# Patient Record
Sex: Female | Born: 2000 | Race: White | Hispanic: No | Marital: Single | Smoking: Never smoker
Health system: Southern US, Community
[De-identification: ages and names within clinical notes are randomized; demographics above are authoritative.]

## PROBLEM LIST (undated history)

## (undated) ENCOUNTER — Inpatient Hospital Stay (HOSPITAL_COMMUNITY): Payer: Self-pay

## (undated) DIAGNOSIS — N159 Renal tubulo-interstitial disease, unspecified: Secondary | ICD-10-CM

## (undated) DIAGNOSIS — K5900 Constipation, unspecified: Secondary | ICD-10-CM

## (undated) HISTORY — PX: NO PAST SURGERIES: SHX2092

---
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
## 2019-01-19 ENCOUNTER — Ambulatory Visit: Payer: Medicaid Other | Admitting: Obstetrics and Gynecology

## 2019-01-21 DIAGNOSIS — Z202 Contact with and (suspected) exposure to infections with a predominantly sexual mode of transmission: Secondary | ICD-10-CM | POA: Diagnosis not present

## 2019-01-26 DIAGNOSIS — Z202 Contact with and (suspected) exposure to infections with a predominantly sexual mode of transmission: Secondary | ICD-10-CM | POA: Diagnosis not present

## 2019-01-28 DIAGNOSIS — A562 Chlamydial infection of genitourinary tract, unspecified: Secondary | ICD-10-CM | POA: Diagnosis not present

## 2019-01-28 DIAGNOSIS — N342 Other urethritis: Secondary | ICD-10-CM | POA: Diagnosis not present

## 2019-01-31 ENCOUNTER — Encounter: Payer: Self-pay | Admitting: Women's Health

## 2019-01-31 ENCOUNTER — Ambulatory Visit (INDEPENDENT_AMBULATORY_CARE_PROVIDER_SITE_OTHER): Payer: Medicaid Other | Admitting: Women's Health

## 2019-01-31 DIAGNOSIS — A749 Chlamydial infection, unspecified: Secondary | ICD-10-CM | POA: Diagnosis not present

## 2019-01-31 MED ORDER — AZITHROMYCIN 500 MG PO TABS: 1000.0000 mg | ORAL_TABLET | Freq: Once | ORAL | 0 refills | Status: AC

## 2019-01-31 NOTE — Progress Notes (Addendum)
   GYN VISIT Patient name: Gina Roman MRN [id number]  Date of birth: February 15, 2001 Chief Complaint:   urgent care friday (told +chlamydia/ taking doxycycline)  History of Present Illness:   Gina Roman is a 18 y.o. G1P0 Caucasian female being seen today for f/u after urgent care visit. Slept w/ previous partner, he told her she needed to get checked for STDs, gave urine specimen at urgent care Wed, rx'd doxycycline Friday for +CT. Had sex w/ partner again last Wed. Has thrown up 3 of the 6 pills so far. Is breastfeeding.      No LMP recorded. (Menstrual status: IUD).  Contraception: IUD Last pap <21yo. Results were:  n/a Review of Systems:   Pertinent items are noted in HPI Denies fever/chills, dizziness, headaches, visual disturbances, fatigue, shortness of breath, chest pain, abdominal pain, vomiting, abnormal vaginal discharge/itching/odor/irritation, problems with periods, bowel movements, urination, or intercourse unless otherwise stated above.  Pertinent History Reviewed:  Reviewed past medical,surgical, social, obstetrical and family history.  Reviewed problem list, medications and allergies. Physical Assessment:   Vitals:   01/31/19 1551  BP: 100/68  Pulse: 83  Weight: 138 lb (62.6 kg)  Height: 5\' 3"  (1.6 m)  Body mass index is 24.45 kg/m.       Physical Examination:   General appearance: alert, well appearing, and in no distress  Mental status: alert, oriented to person, place, and time  Skin: warm & dry   Cardiovascular: normal heart rate noted  Respiratory: normal respiratory effort, no distress  Abdomen: soft, non-tender   Pelvic: examination not indicated  Extremities: no edema   No results found for this or any previous visit (from the past 24 hour(s)).  Assessment & Plan:  1) Recent +CT> vomited 1/2 of doxycycline so far. Stop doxycycline. Rx azithromycin 1gm po x 1. No sex x at least 1wk from time finished taking meds. Partner needs tx, if wants me to tx  call back w/ info, otherwise go to HD or PCP. Return in 3-4wks for POC  Meds:  Meds ordered this encounter  Medications  . azithromycin (ZITHROMAX) 500 MG tablet    Sig: Take 2 tablets (1,000 mg total) by mouth once for 1 dose.    Dispense:  2 tablet    Refill:  0    Order Specific Question:   Supervising Provider    Answer:   Despina Hidden, LUTHER H [[id number]]    No orders of the defined types were placed in this encounter.   Return in about 3 weeks (around 02/21/2019) for urine specimen for lab.  Cheral Marker CNM, [facility] 01/31/2019 4:18 PM

## 2019-02-03 ENCOUNTER — Ambulatory Visit: Payer: Medicaid Other | Admitting: Advanced Practice Midwife

## 2019-02-11 DIAGNOSIS — Z113 Encounter for screening for infections with a predominantly sexual mode of transmission: Secondary | ICD-10-CM | POA: Diagnosis not present

## 2019-02-21 ENCOUNTER — Other Ambulatory Visit: Payer: Medicaid Other

## 2019-05-13 DIAGNOSIS — Z5181 Encounter for therapeutic drug level monitoring: Secondary | ICD-10-CM | POA: Diagnosis not present

## 2019-05-16 DIAGNOSIS — Z5181 Encounter for therapeutic drug level monitoring: Secondary | ICD-10-CM | POA: Diagnosis not present

## 2019-05-17 ENCOUNTER — Telehealth: Payer: Self-pay | Admitting: Adult Health

## 2019-05-17 NOTE — Telephone Encounter (Signed)
Patient called, would like to be checked for STD.  Please advise.  [place]  [phone]

## 2019-05-17 NOTE — Telephone Encounter (Signed)
Spoke with pt. Pt wants STD screen. Pt is not having any symptoms. Advised pt can come in for self swab. Pt voiced understanding. Attempted to transfer call to front desk but was advised if call got disconnected, call office back for appt. Gina Roman

## 2019-05-19 ENCOUNTER — Other Ambulatory Visit: Payer: Medicaid Other

## 2019-05-20 ENCOUNTER — Other Ambulatory Visit: Payer: Medicaid Other

## 2019-05-20 DIAGNOSIS — Z5181 Encounter for therapeutic drug level monitoring: Secondary | ICD-10-CM | POA: Diagnosis not present

## 2019-05-23 ENCOUNTER — Other Ambulatory Visit (INDEPENDENT_AMBULATORY_CARE_PROVIDER_SITE_OTHER): Payer: Medicaid Other

## 2019-05-23 ENCOUNTER — Other Ambulatory Visit: Payer: Self-pay

## 2019-05-23 DIAGNOSIS — R309 Painful micturition, unspecified: Secondary | ICD-10-CM

## 2019-05-23 DIAGNOSIS — R319 Hematuria, unspecified: Secondary | ICD-10-CM | POA: Diagnosis not present

## 2019-05-23 DIAGNOSIS — Z5181 Encounter for therapeutic drug level monitoring: Secondary | ICD-10-CM | POA: Diagnosis not present

## 2019-05-23 DIAGNOSIS — Z113 Encounter for screening for infections with a predominantly sexual mode of transmission: Secondary | ICD-10-CM

## 2019-05-23 LAB — POCT URINALYSIS DIPSTICK
Glucose, UA: NEGATIVE
Ketones, UA: NEGATIVE
Nitrite, UA: NEGATIVE
Protein, UA: POSITIVE — AB

## 2019-05-23 NOTE — Progress Notes (Signed)
Pt here for NuSwab for STD screening. Pt also has pain with urination, + pressure. I dipped urine and it was + for blood, protein and leuks. I sent urine to lab for culture and urinalysis. Pt voiced understanding. [place]

## 2019-05-24 LAB — MICROSCOPIC EXAMINATION
Casts: NONE SEEN /lpf
Epithelial Cells (non renal): >10 /hpf — AB (ref 0–10)

## 2019-05-24 LAB — URINALYSIS, ROUTINE W REFLEX MICROSCOPIC
Bilirubin, UA: NEGATIVE
Glucose, UA: NEGATIVE
Ketones, UA: NEGATIVE
Nitrite, UA: NEGATIVE
Specific Gravity, UA: 1.016 (ref 1.005–1.030)
Urobilinogen, Ur: 1 mg/dL (ref 0.2–1.0)
pH, UA: 8 — ABNORMAL HIGH (ref 5.0–7.5)

## 2019-05-25 ENCOUNTER — Other Ambulatory Visit: Payer: Self-pay

## 2019-05-25 ENCOUNTER — Emergency Department (HOSPITAL_COMMUNITY)
Admission: EM | Admit: 2019-05-25 | Discharge: 2019-05-25 | Disposition: A | Discharge status: Home / Self Care | Payer: Medicaid Other | Attending: Emergency Medicine | Admitting: Emergency Medicine

## 2019-05-25 ENCOUNTER — Encounter (HOSPITAL_COMMUNITY): Payer: Self-pay | Admitting: Emergency Medicine

## 2019-05-25 DIAGNOSIS — R1030 Lower abdominal pain, unspecified: Secondary | ICD-10-CM | POA: Diagnosis present

## 2019-05-25 DIAGNOSIS — N3090 Cystitis, unspecified without hematuria: Secondary | ICD-10-CM | POA: Insufficient documentation

## 2019-05-25 DIAGNOSIS — Z79899 Other long term (current) drug therapy: Secondary | ICD-10-CM | POA: Diagnosis not present

## 2019-05-25 DIAGNOSIS — Z5181 Encounter for therapeutic drug level monitoring: Secondary | ICD-10-CM | POA: Diagnosis not present

## 2019-05-25 DIAGNOSIS — Z7722 Contact with and (suspected) exposure to environmental tobacco smoke (acute) (chronic): Secondary | ICD-10-CM | POA: Insufficient documentation

## 2019-05-25 LAB — URINALYSIS, ROUTINE W REFLEX MICROSCOPIC
Bilirubin Urine: NEGATIVE
Glucose, UA: NEGATIVE mg/dL
Ketones, ur: NEGATIVE mg/dL
Nitrite: NEGATIVE
Protein, ur: NEGATIVE mg/dL
Specific Gravity, Urine: 1.006 (ref 1.005–1.030)
WBC, UA: >50 WBC/hpf — ABNORMAL HIGH (ref 0–5)
pH: 6 (ref 5.0–8.0)

## 2019-05-25 LAB — URINE CULTURE

## 2019-05-25 LAB — PREGNANCY, URINE: Preg Test, Ur: NEGATIVE

## 2019-05-25 MED ORDER — PHENAZOPYRIDINE HCL 200 MG PO TABS: 200.0000 mg | ORAL_TABLET | Freq: Three times a day (TID) | ORAL | 0 refills | Status: DC

## 2019-05-25 MED ORDER — CEPHALEXIN 500 MG PO CAPS: 500.0000 mg | ORAL_CAPSULE | Freq: Two times a day (BID) | ORAL | 0 refills | Status: AC

## 2019-05-25 NOTE — ED Triage Notes (Signed)
Pt c/o of dysuria, hematuria, foul odor with urination x 1 week

## 2019-05-25 NOTE — ED Notes (Signed)
Pt verbalized understanding. Signature pad not working

## 2019-05-25 NOTE — Discharge Instructions (Signed)
Please take all of your antibiotics until finished!   You may develop abdominal discomfort or diarrhea from the antibiotic.  You may help offset this with probiotics which you can buy or get in yogurt. Do not eat or take the probiotics until 2 hours after your antibiotic.   Continue to stay well-hydrated by drinking plenty water. Follow-up with a primary care provider should symptoms fail to resolve.  Return to the ED for development of fever, increased pain, pain in the sides or back, or any other major concerns.

## 2019-05-25 NOTE — ED Provider Notes (Signed)
[facility] EMERGENCY DEPARTMENT Provider Note   CSN: [id number] Arrival date & time: 05/25/19  1339    History   Chief Complaint Chief Complaint  Patient presents with  . Dysuria    HPI Gina Roman is a 18 y.o. female.     HPI   Gina Roman is a 18 y.o. female, patient with no pertinent past medical history, presenting to the ED with suprapubic discomfort for last week.  Sensation was intermittent surrounding times of urination, but has started to be more constant.  Sensation is mild to moderate and radiates into the genital region.  She has noted a foul odor and cloudiness to the urine as well as occasional hematuria. She does not have menstrual cycles due to an IUD placed about a year ago.  Denies fever/chills, N/V/C/D, vaginal discharge, vaginal bleeding, flank/back pain, other abdominal pain, or any other complaints.    Past Medical History:  Diagnosis Date  . Constipation   . Kidney infection     Patient Active Problem List   Diagnosis Date Noted  . Chlamydia 01/31/2019    Past Surgical History:  Procedure Laterality Date  . NO PAST SURGERIES       OB History    Gravida  1   Para      Term      Preterm      AB      Living        SAB      TAB      Ectopic      Multiple  1   Live Births               Home Medications    Prior to Admission medications   Medication Sig Start Date End Date Taking? Authorizing Provider  cephALEXin (KEFLEX) 500 MG capsule Take 1 capsule (500 mg total) by mouth 2 (two) times daily for 3 days. 05/25/19 05/28/19  Paelyn Smick C, PA-C  diphenhydrAMINE (BENADRYL) 25 MG tablet Take 25 mg by mouth every 6 (six) hours as needed for itching or allergies.    [provider]  doxycycline (VIBRAMYCIN) 100 MG capsule Take 100 mg by mouth 2 (two) times daily.    [provider]  phenazopyridine (PYRIDIUM) 200 MG tablet Take 1 tablet (200 mg total) by mouth 3 (three) times daily. 05/25/19   Oberon Hehir,  Onesti Bonfiglio C, PA-C  prenatal vitamin w/FE, FA (PRENATAL 1 + 1) 27-1 MG TABS tablet Take 1 tablet by mouth daily at 12 noon.    [provider]  sertraline (ZOLOFT) 25 MG tablet Take 1 tablet (25 mg total) by mouth daily. Patient not taking: Reported on 01/31/2019 09/01/18   Jacklyn Shellresenzo-Dishmon, Frances, CNM    Family History No family history on file.  Social History Social History   Tobacco Use  . Smoking status: Passive Smoke Exposure - Never Smoker  . Smokeless tobacco: Never Used  Substance Use Topics  . Alcohol use: No  . Drug use: No     Allergies   Patient has no known allergies.   Review of Systems Review of Systems  Constitutional: Negative for fever.  Gastrointestinal: Positive for abdominal pain. Negative for diarrhea, nausea and vomiting.  Genitourinary: Positive for dysuria and hematuria. Negative for difficulty urinating, flank pain, vaginal bleeding and vaginal discharge.  Musculoskeletal: Negative for back pain.  All other systems reviewed and are negative.    Physical Exam Updated Vital Signs BP 106/73 (BP Location: Right Arm)   Pulse Marland Kitchen(!)  106   Temp 98.4 F (36.9 C) (Oral)   Resp 12   Ht 5\' 4"  (1.626 m)   Wt 59 kg   SpO2 97%   BMI 22.31 kg/m   Physical Exam Vitals signs and nursing note reviewed.  Constitutional:      General: She is not in acute distress.    Appearance: She is well-developed. She is not diaphoretic.  HENT:     Head: Normocephalic and atraumatic.     Mouth/Throat:     Mouth: Mucous membranes are moist.     Pharynx: Oropharynx is clear.  Eyes:     Conjunctiva/sclera: Conjunctivae normal.  Neck:     Musculoskeletal: Neck supple.  Cardiovascular:     Rate and Rhythm: Normal rate and regular rhythm.     Pulses: Normal pulses.          Radial pulses are 2+ on the right side and 2+ on the left side.       Posterior tibial pulses are 2+ on the right side and 2+ on the left side.     Heart sounds: Normal heart sounds.   Pulmonary:     Effort: Pulmonary effort is normal. No respiratory distress.     Breath sounds: Normal breath sounds.  Abdominal:     Palpations: Abdomen is soft.     Tenderness: There is abdominal tenderness (mild). There is no guarding.    Musculoskeletal:     Right lower leg: No edema.     Left lower leg: No edema.  Lymphadenopathy:     Cervical: No cervical adenopathy.  Skin:    General: Skin is warm and dry.  Neurological:     Mental Status: She is alert.  Psychiatric:        Mood and Affect: Mood and affect normal.        Speech: Speech normal.        Behavior: Behavior normal.      ED Treatments / Results  Labs (all labs ordered are listed, but only abnormal results are displayed) Labs Reviewed  URINALYSIS, ROUTINE W REFLEX MICROSCOPIC - Abnormal; Notable for the following components:      Result Value   Hgb urine dipstick LARGE (*)    Leukocytes,Ua LARGE (*)    WBC, UA >50 (*)    Bacteria, UA MANY (*)    All other components within normal limits  URINE CULTURE  PREGNANCY, URINE  GC/CHLAMYDIA PROBE AMP () NOT AT [facility]    EKG None  Radiology No results found.  Procedures Procedures (including critical care time)  Medications Ordered in ED Medications - No data to display   Initial Impression / Assessment and Plan / ED Course  I have reviewed the triage vital signs and the nursing notes.  Pertinent labs & imaging results that were available during my care of the patient were reviewed by me and considered in my medical decision making (see chart for details).  Clinical Course as of May 24 1524  Wed May 25, 2019  1510 Patient had a pulse of 92 during my exam.  Pulse Rate(!): 106 [SJ]    Clinical Course User Index [SJ] Manya Balash C, PA-C       Patient presents with suprapubic pressure and pain with urination.  She does not have signs of systemic illness.  When I asked if patient had concern for STDs and if she wanted STD testing, she  stated she might be interested in this.  Pelvic exam  with swabs was discussed and encouraged with explanation.  Patient declined this exam.  She also declined HIV and RPR testing.  Alternatives were discussed including follow-up with PCP, OB/GYN, or health department. She asked if we could run the GC/chlamydia from the urine.  We discussed the decreased accuracy in female patients using urine for this test.  Patient voiced understanding.  There is some evidence of UTI on patient's UA.  We will send urine culture and treat with antibiotics. Return precautions discussed. Patient voices understanding of these instructions, accepts the plan, and is comfortable with discharge.  Final Clinical Impressions(s) / ED Diagnoses   Final diagnoses:  Cystitis    ED Discharge Orders         Ordered    cephALEXin (KEFLEX) 500 MG capsule  2 times daily     05/25/19 1516    phenazopyridine (PYRIDIUM) 200 MG tablet  3 times daily     05/25/19 1518           Anselm PancoastJoy, Romario Tith C, PA-C 05/25/19 1529    Blane OharaZavitz, Joshua, MD 05/25/19 1537

## 2019-05-26 ENCOUNTER — Telehealth: Payer: Self-pay | Admitting: Adult Health

## 2019-05-26 LAB — NUSWAB VAGINITIS PLUS (VG+)
Candida albicans, NAA: NEGATIVE
Candida glabrata, NAA: NEGATIVE
Chlamydia trachomatis, NAA: NEGATIVE
Neisseria gonorrhoeae, NAA: NEGATIVE
Trich vag by NAA: NEGATIVE

## 2019-05-26 LAB — GC/CHLAMYDIA PROBE AMP (~~LOC~~) NOT AT ARMC
Chlamydia: NEGATIVE
Neisseria Gonorrhea: NEGATIVE

## 2019-05-26 LAB — URINE CULTURE

## 2019-05-26 NOTE — Telephone Encounter (Signed)
Pt aware that nuswab is negative

## 2019-05-30 DIAGNOSIS — Z5181 Encounter for therapeutic drug level monitoring: Secondary | ICD-10-CM | POA: Diagnosis not present

## 2019-06-03 DIAGNOSIS — Z5181 Encounter for therapeutic drug level monitoring: Secondary | ICD-10-CM | POA: Diagnosis not present

## 2019-06-28 ENCOUNTER — Telehealth: Payer: Self-pay | Admitting: Advanced Practice Midwife

## 2019-06-28 DIAGNOSIS — N760 Acute vaginitis: Secondary | ICD-10-CM | POA: Diagnosis not present

## 2019-06-28 NOTE — Telephone Encounter (Signed)
Pt is calling made appt next week to have her Iud checked but wanted to know if she could come in and give urine sample for possible uti

## 2019-06-28 NOTE — Telephone Encounter (Signed)
Left message for pt to call back to schedule.

## 2019-07-05 ENCOUNTER — Telehealth: Payer: Self-pay | Admitting: Obstetrics and Gynecology

## 2019-07-05 NOTE — Telephone Encounter (Signed)
error 

## 2019-07-06 ENCOUNTER — Ambulatory Visit: Payer: Medicaid Other | Admitting: Obstetrics and Gynecology

## 2019-08-24 ENCOUNTER — Telehealth: Payer: Self-pay | Admitting: *Deleted

## 2019-08-24 NOTE — Telephone Encounter (Signed)
Pt left message requesting std screening appt asap. Attempted to call patient back but phone went straight to vm that was not set up.

## 2019-09-08 DIAGNOSIS — N760 Acute vaginitis: Secondary | ICD-10-CM | POA: Diagnosis not present

## 2019-09-08 DIAGNOSIS — B9689 Other specified bacterial agents as the cause of diseases classified elsewhere: Secondary | ICD-10-CM | POA: Diagnosis not present

## 2019-10-04 ENCOUNTER — Telehealth: Payer: Self-pay | Admitting: Obstetrics & Gynecology

## 2019-10-04 NOTE — Telephone Encounter (Signed)

## 2019-10-05 ENCOUNTER — Other Ambulatory Visit: Payer: Self-pay

## 2019-10-05 ENCOUNTER — Other Ambulatory Visit: Payer: Medicaid Other

## 2019-10-05 ENCOUNTER — Other Ambulatory Visit (HOSPITAL_COMMUNITY)
Admission: RE | Admit: 2019-10-05 | Discharge: 2019-10-05 | Disposition: A | Discharge status: Home / Self Care | Payer: Medicaid Other | Source: Ambulatory Visit | Attending: Obstetrics & Gynecology | Admitting: Obstetrics & Gynecology

## 2019-10-05 DIAGNOSIS — N898 Other specified noninflammatory disorders of vagina: Secondary | ICD-10-CM | POA: Diagnosis not present

## 2019-10-05 NOTE — Progress Notes (Signed)
Chart reviewed for nurse visit. Agree with plan of care.  Estill Dooms, NP 10/05/2019 12:20 PM

## 2019-10-05 NOTE — Progress Notes (Signed)
   NURSE VISIT- VAGINITIS/STD  SUBJECTIVE:  Gina Roman is a 18 y.o. G1P0 GYN patientfemale here for a vaginal swab for vaginitis screening, STD screen.  She reports the following symptoms: discharge described as curd-like and itching for 1 week. Denies abnormal vaginal bleeding, significant pelvic pain, fever, or UTI symptoms.  OBJECTIVE:  There were no vitals taken for this visit.  Appears well, in no apparent distress  ASSESSMENT: Vaginal swab for vaginitis screening/STD screen  PLAN: Self-collected vaginal probe for Gonorrhea, Chlamydia, Trichomonas, Bacterial Vaginosis, Yeast sent to lab Treatment: to be determined once results are received Follow-up as needed if symptoms persist/worsen, or new symptoms develop  Alice Rieger  10/05/2019 9:16 AM

## 2019-10-06 LAB — CERVICOVAGINAL ANCILLARY ONLY
Bacterial Vaginitis (gardnerella): POSITIVE — AB
Candida Glabrata: NEGATIVE
Candida Vaginitis: POSITIVE — AB
Chlamydia: POSITIVE — AB
Comment: NEGATIVE
Comment: NEGATIVE
Comment: NEGATIVE
Comment: NEGATIVE
Comment: NEGATIVE
Comment: NORMAL
Neisseria Gonorrhea: NEGATIVE
Trichomonas: NEGATIVE

## 2019-10-10 ENCOUNTER — Telehealth: Payer: Self-pay | Admitting: Adult Health

## 2019-10-10 ENCOUNTER — Encounter: Payer: Self-pay | Admitting: Adult Health

## 2019-10-10 DIAGNOSIS — N760 Acute vaginitis: Secondary | ICD-10-CM | POA: Insufficient documentation

## 2019-10-10 DIAGNOSIS — B9689 Other specified bacterial agents as the cause of diseases classified elsewhere: Secondary | ICD-10-CM | POA: Insufficient documentation

## 2019-10-10 DIAGNOSIS — A749 Chlamydial infection, unspecified: Secondary | ICD-10-CM

## 2019-10-10 HISTORY — DX: Other specified bacterial agents as the cause of diseases classified elsewhere: B96.89

## 2019-10-10 MED ORDER — METRONIDAZOLE 500 MG PO TABS: 500.0000 mg | ORAL_TABLET | Freq: Two times a day (BID) | ORAL | 0 refills | Status: DC

## 2019-10-10 MED ORDER — FLUCONAZOLE 150 MG PO TABS: ORAL_TABLET | ORAL | 1 refills | Status: DC

## 2019-10-10 MED ORDER — AZITHROMYCIN 500 MG PO TABS: ORAL_TABLET | ORAL | 0 refills | Status: DC

## 2019-10-10 NOTE — Telephone Encounter (Signed)
Pt aware that CV swab, +BV,YEast and chlamydia, will rx flagyl 500 mg 1 bid x 7 days and diflucan and azithromycin 500 mg #2 2 po now , no sex, POC 12/7 at 9 am for self swab, and partner to clinic, [facility] sent.

## 2019-10-10 NOTE — Telephone Encounter (Signed)
Kyia called back and wants me to treat partner, Carollee Herter, DOB 11/21/1999, allergic to PCN, will Rx azithromycin 500 mg #2 2 po now, at [place] on [address] at [phone].

## 2019-10-11 ENCOUNTER — Other Ambulatory Visit: Payer: Medicaid Other

## 2019-11-11 ENCOUNTER — Other Ambulatory Visit: Payer: Medicaid Other

## 2020-01-04 ENCOUNTER — Other Ambulatory Visit (HOSPITAL_COMMUNITY)
Admission: RE | Admit: 2020-01-04 | Discharge: 2020-01-04 | Disposition: A | Discharge status: Home / Self Care | Payer: Medicaid Other | Source: Ambulatory Visit | Attending: Advanced Practice Midwife | Admitting: Advanced Practice Midwife

## 2020-01-04 ENCOUNTER — Ambulatory Visit (INDEPENDENT_AMBULATORY_CARE_PROVIDER_SITE_OTHER): Payer: Medicaid Other | Admitting: Advanced Practice Midwife

## 2020-01-04 ENCOUNTER — Encounter: Payer: Self-pay | Admitting: Advanced Practice Midwife

## 2020-01-04 ENCOUNTER — Other Ambulatory Visit: Payer: Self-pay

## 2020-01-04 VITALS — BP 105/68 | HR 87 | Ht 64.0 in | Wt 145.2 lb

## 2020-01-04 DIAGNOSIS — A749 Chlamydial infection, unspecified: Secondary | ICD-10-CM

## 2020-01-04 DIAGNOSIS — R5383 Other fatigue: Secondary | ICD-10-CM | POA: Diagnosis not present

## 2020-01-04 NOTE — Progress Notes (Signed)
   GYN VISIT Patient name: Gina Roman MRN [id number]  Date of birth: 08/24/2001 Chief Complaint:   Feels tired; didn't get TOC for chlamydia  History of Present Illness:   Gina Roman is a 19 y.o. G1P0 Caucasian female being seen today for eval of feeling tired. Also was tx for chlamydia in November 2020 and didn't have POC. She reports being incontinent of diarrhea x 2 as well and is concerned if that is related to her fatigue.   No LMP recorded. (Menstrual status: IUD). The current method of family planning is IUD.  Last pap <20yo.  Review of Systems:   Pertinent items are noted in HPI Denies fever/chills, dizziness, headaches, visual disturbances, fatigue, shortness of breath, chest pain, abdominal pain, vomiting, abnormal vaginal discharge/itching/odor/irritation, problems with periods, bowel movements, urination, or intercourse unless otherwise stated above.  Pertinent History Reviewed:  Reviewed past medical,surgical, social, obstetrical and family history.  Reviewed problem list, medications and allergies. Physical Assessment:   Vitals:   01/04/20 1013  BP: 105/68  Pulse: 87  Weight: 145 lb 3.2 oz (65.9 kg)  Height: 5\' 4"  (1.626 m)  Body mass index is 24.92 kg/m.       Physical Examination:   General appearance: alert, well appearing, and in no distress  Mental status: alert, oriented to person, place, and time  Skin: warm & dry   Cardiovascular: normal heart rate noted  Respiratory: normal respiratory effort, no distress  Abdomen: soft, non-tender   Pelvic: normal external genitalia, vulva, vagina, cervix, uterus and adnexa, examination not indicated  Extremities: no edema   Chaperone: n/a    No results found for this or any previous visit (from the past 24 hour(s)).  Assessment & Plan:  1) Fatigue> requesting check of B12, will also get Vit D and CBC  2) Needs chlamydia POC> collected today  3) Incontinent of diarrhea> given list of fiber-rich foods;  if diarrhea resolves and incontinence continues, rec see PCP  Meds: No orders of the defined types were placed in this encounter.   Orders Placed This Encounter  Procedures  . B12  . CBC  . Vitamin D (25 hydroxy)    Return for prn.  CNM 01/04/2020 11:46 AM

## 2020-01-04 NOTE — Patient Instructions (Signed)
Fiber Content in Foods  See the following list for the dietary fiber content of some common foods. High-fiber foods High-fiber foods contain 4 grams or more (4g or more) of fiber per serving. They include:  Artichoke (fresh) -- 1 medium has 10.3g of fiber.  Baked beans, plain or vegetarian (canned) --  cup has 5.2g of fiber.  Blackberries or raspberries (fresh) --  cup has 4g of fiber.  Bran cereal --  cup has 8.6g of fiber.  Bulgur (cooked) --  cup has 4g of fiber.  Kidney beans (canned) --  cup has 6.8g of fiber.  Lentils (cooked) --  cup has 7.8g of fiber.  Pear (fresh) -- 1 medium has 5.1g of fiber.  Peas (frozen) --  cup has 4.4g of fiber.  Pinto beans (canned) --  cup has 5.5g of fiber.  Pinto beans (dried and cooked) --  cup has 7.7g of fiber.  Potato with skin (baked) -- 1 medium has 4.4g of fiber.  Quinoa (cooked) --  cup has 5g of fiber.  Soybeans (canned, frozen, or fresh) --  cup has 5.1g of fiber. Moderate-fiber foods Moderate-fiber foods contain 1-4 grams (1-4g) of fiber per serving. They include:  Almonds -- 1 oz. has 3.5g of fiber.  Apple with skin -- 1 medium has 3.3g of fiber.  Applesauce, sweetened --  cup has 1.5g of fiber.  Bagel, plain -- one 4-inch (10-cm) bagel has 2g of fiber.  Banana -- 1 medium has 3.1g of fiber.  Broccoli (cooked) --  cup has 2.5g of fiber.  Carrots (cooked) --  cup has 2.3g of fiber.  Corn (canned or frozen) --  cup has 2.1g of fiber.  Corn tortilla -- one 6-inch (15-cm) tortilla has 1.5g of fiber.  Green beans (canned) --  cup has 2g of fiber.  Instant oatmeal --  cup has about 2g of fiber.  Long-grain brown rice (cooked) -- 1 cup has 3.5g of fiber.  Macaroni, enriched (cooked) -- 1 cup has 2.5g of fiber.  Melon -- 1 cup has 1.4g of fiber.  Multigrain cereal --  cup has about 2-4g of fiber.  Orange -- 1 small has 3.1g of fiber.  Potatoes, mashed --  cup has 1.6g of fiber.  Raisins  -- 1/4 cup has 1.6g of fiber.  Squash --  cup has 2.9g of fiber.  Sunflower seeds --  cup has 1.1g of fiber.  Tomato -- 1 medium has 1.5g of fiber.  Vegetable or soy patty -- 1 has 3.4g of fiber.  Whole-wheat bread -- 1 slice has 2g of fiber.  Whole-wheat spaghetti --  cup has 3.2g of fiber. Low-fiber foods Low-fiber foods contain less than 1 gram (less than 1g) of fiber per serving. They include:  Egg -- 1 large.  Flour tortilla -- one 6-inch (15-cm) tortilla.  Fruit juice --  cup.  Lettuce -- 1 cup.  Meat, poultry, or fish -- 1 oz.  Milk -- 1 cup.  Spinach (raw) -- 1 cup.  White bread -- 1 slice.  White rice --  cup.  Yogurt --  cup. Actual amounts of fiber in foods may be different depending on processing. Talk with your dietitian about how much fiber you need in your diet. This information is not intended to replace advice given to you by your health care provider. Make sure you discuss any questions you have with your health care provider. Document Revised: 07/10/2016 Document Reviewed: 01/10/2016 Elsevier Patient Education  2020 Elsevier Inc.  

## 2020-01-05 LAB — CERVICOVAGINAL ANCILLARY ONLY
Chlamydia: NEGATIVE
Comment: NEGATIVE
Comment: NEGATIVE
Comment: NORMAL
Neisseria Gonorrhea: NEGATIVE
Trichomonas: NEGATIVE

## 2020-03-02 DIAGNOSIS — N760 Acute vaginitis: Secondary | ICD-10-CM | POA: Diagnosis not present

## 2020-03-02 DIAGNOSIS — B9689 Other specified bacterial agents as the cause of diseases classified elsewhere: Secondary | ICD-10-CM | POA: Diagnosis not present

## 2020-03-29 DIAGNOSIS — B9689 Other specified bacterial agents as the cause of diseases classified elsewhere: Secondary | ICD-10-CM | POA: Diagnosis not present

## 2020-03-29 DIAGNOSIS — N760 Acute vaginitis: Secondary | ICD-10-CM | POA: Diagnosis not present

## 2020-04-20 ENCOUNTER — Ambulatory Visit
Admission: EM | Admit: 2020-04-20 | Discharge: 2020-04-20 | Disposition: A | Discharge status: Home / Self Care | Payer: Medicaid Other | Attending: Family Medicine | Admitting: Family Medicine

## 2020-04-20 ENCOUNTER — Other Ambulatory Visit: Payer: Self-pay

## 2020-04-20 DIAGNOSIS — N898 Other specified noninflammatory disorders of vagina: Secondary | ICD-10-CM | POA: Diagnosis not present

## 2020-04-20 LAB — POCT URINALYSIS DIP (MANUAL ENTRY)
Bilirubin, UA: NEGATIVE
Blood, UA: NEGATIVE
Glucose, UA: NEGATIVE mg/dL
Ketones, POC UA: NEGATIVE mg/dL
Nitrite, UA: NEGATIVE
Protein Ur, POC: NEGATIVE mg/dL
Spec Grav, UA: 1.015 (ref 1.010–1.025)
Urobilinogen, UA: 0.2 E.U./dL
pH, UA: 7 (ref 5.0–8.0)

## 2020-04-20 MED ORDER — FLUCONAZOLE 150 MG PO TABS: ORAL_TABLET | ORAL | 0 refills | Status: DC

## 2020-04-20 NOTE — ED Triage Notes (Signed)
Pt c/o thick, white vaginal discharge. Recently treated for BV. Also c/o low back pain, history of UTIs and feels the same

## 2020-04-20 NOTE — Discharge Instructions (Addendum)
I have sent in diflucan for you to take one tablet today, and if still having symptoms take the second tablet in 3 days.  Your swab results will be back by Monday.

## 2020-04-20 NOTE — ED Provider Notes (Signed)
[facility]   [id number] 04/20/20 Arrival Time: 1920   CC: VAGINAL DISCHARGE  SUBJECTIVE:  Zoeya Gramajo is a 19 y.o. female who presents with complaints of gradual vaginal discharge that began a week ago. She reports recent sexual encounter and recent antibiotic use. Reports that she was treated for BV and that she finished the medication last week. Patient is sexually active with one female partner. Describes discharge as Therapist, art. She has not tried OTC medications. She reports worsening symptoms with intercourse and urination.  She reports similar symptoms in the past and was diagnosed with BV and treated with antibiotics. She denies fever, chills, nausea, vomiting, abdominal or pelvic pain, vaginal odor, vaginal bleeding, dyspareunia, vaginal rashes or lesions.   No LMP recorded (lmp unknown). (Menstrual status: IUD). Current birth control method: Copper IUD Compliant with BC: yes  ROS: As per HPI.  All other pertinent ROS negative.     Past Medical History:  Diagnosis Date  . BV (bacterial vaginosis) 10/10/2019  . Constipation   . Kidney infection    Past Surgical History:  Procedure Laterality Date  . NO PAST SURGERIES     No Known Allergies No current facility-administered medications on file prior to encounter.   No current outpatient medications on file prior to encounter.    Social History   Socioeconomic History  . Marital status: Single    Spouse name: Not on file  . Number of children: 1  . Years of education: Not on file  . Highest education level: Not on file  Occupational History  . Not on file  Tobacco Use  . Smoking status: Passive Smoke Exposure - Never Smoker  . Smokeless tobacco: Never Used  Substance and Sexual Activity  . Alcohol use: No  . Drug use: No  . Sexual activity: Yes    Birth control/protection: None, I.U.D.  Other Topics Concern  . Not on file  Social History Narrative  . Not on file   Social  Determinants of Health   Financial Resource Strain:   . Difficulty of Paying Living Expenses:   Food Insecurity:   . Worried About Charity fundraiser in the Last Year:   . Arboriculturist in the Last Year:   Transportation Needs:   . Film/video editor (Medical):   Marland Kitchen Lack of Transportation (Non-Medical):   Physical Activity:   . Days of Exercise per Week:   . Minutes of Exercise per Session:   Stress:   . Feeling of Stress :   Social Connections:   . Frequency of Communication with Friends and Family:   . Frequency of Social Gatherings with Friends and Family:   . Attends Religious Services:   . Active Member of Clubs or Organizations:   . Attends Archivist Meetings:   Marland Kitchen Marital Status:   Intimate Partner Violence:   . Fear of Current or Ex-Partner:   . Emotionally Abused:   Marland Kitchen Physically Abused:   . Sexually Abused:    No family history on file.  OBJECTIVE:  Vitals:   04/20/20 1930  BP: 110/71  Pulse: 84  Resp: 16  Temp: 98 F (36.7 C)  SpO2: 96%     General appearance: Alert, NAD, appears stated age Head: NCAT Throat: lips, mucosa, and tongue normal; teeth and gums normal Lungs: CTA bilaterally without adventitious breath sounds Heart: regular rate and rhythm.  Radial pulses 2+ symmetrical bilaterally Back: no CVA tenderness Abdomen: soft, non-tender; bowel sounds normal;  no masses or organomegaly; no guarding or rebound tenderness GU: declines  Skin: warm and dry Psychological:  Alert and cooperative. Normal mood and affect.  LABS:  Results for orders placed or performed during the hospital encounter of 04/20/20  POCT urinalysis dipstick  Result Value Ref Range   Color, UA yellow yellow   Clarity, UA clear clear   Glucose, UA negative negative mg/dL   Bilirubin, UA negative negative   Ketones, POC UA negative negative mg/dL   Spec Grav, UA 7.673 4.193 - 1.025   Blood, UA negative negative   pH, UA 7.0 5.0 - 8.0   Protein Ur, POC  negative negative mg/dL   Urobilinogen, UA 0.2 0.2 or 1.0 E.U./dL   Nitrite, UA Negative Negative   Leukocytes, UA Trace (A) Negative    Labs Reviewed  POCT URINALYSIS DIP (MANUAL ENTRY) - Abnormal; Notable for the following components:      Result Value   Leukocytes, UA Trace (*)    All other components within normal limits  URINE CULTURE  CERVICOVAGINAL ANCILLARY ONLY    ASSESSMENT & PLAN:  1. Vaginal discharge   2. Vaginal itching     Meds ordered this encounter  Medications  . fluconazole (DIFLUCAN) 150 MG tablet    Sig: Take one tablet at the onset of symptoms, if still having symptoms in 3 days, take the second tablet.    Dispense:  2 tablet    Refill:  0    Order Specific Question:   Supervising Provider    Answer:   Merrilee Jansky [[id number]]    Pending: Labs Reviewed  POCT URINALYSIS DIP (MANUAL ENTRY) - Abnormal; Notable for the following components:      Result Value   Leukocytes, UA Trace (*)    All other components within normal limits  URINE CULTURE  CERVICOVAGINAL ANCILLARY ONLY   UA not suspicious for UTI today Vaginal self-swab obtained.  We will follow up with you regarding abnormal results Declines HIV/ syphilis testing today Prescribed diflucan 150 mg once daily and then second dose 72 hours later Take medications as prescribed and to completion If tests results are positive, please abstain from sexual activity until you and your partner(s) have been treated Follow up with PCP or [facility] if symptoms persists Return here or go to ER if you have any new or worsening symptoms fever, chills, nausea, vomiting, abdominal or pelvic pain, painful intercourse, vaginal discharge, vaginal bleeding, persistent symptoms despite treatment.  Reviewed expectations re: course of current medical issues. Questions answered. Outlined signs and symptoms indicating need for more acute intervention. Patient verbalized understanding. After Visit Summary  given.        Moshe Cipro, NP 04/21/20 1028

## 2020-04-22 LAB — URINE CULTURE: Culture: NO GROWTH

## 2020-04-23 LAB — CERVICOVAGINAL ANCILLARY ONLY
Bacterial Vaginitis (gardnerella): POSITIVE — AB
Candida Glabrata: NEGATIVE
Candida Vaginitis: POSITIVE — AB
Chlamydia: NEGATIVE
Comment: NEGATIVE
Comment: NEGATIVE
Comment: NEGATIVE
Comment: NEGATIVE
Comment: NEGATIVE
Comment: NORMAL
Neisseria Gonorrhea: NEGATIVE
Trichomonas: NEGATIVE

## 2020-04-24 ENCOUNTER — Telehealth (HOSPITAL_COMMUNITY): Payer: Self-pay

## 2020-04-24 MED ORDER — METRONIDAZOLE 500 MG PO TABS: 500.0000 mg | ORAL_TABLET | Freq: Two times a day (BID) | ORAL | 0 refills | Status: DC

## 2020-05-08 ENCOUNTER — Other Ambulatory Visit: Payer: Self-pay

## 2020-05-08 ENCOUNTER — Ambulatory Visit
Admission: EM | Admit: 2020-05-08 | Discharge: 2020-05-08 | Disposition: A | Discharge status: Home / Self Care | Payer: Medicaid Other | Attending: Emergency Medicine | Admitting: Emergency Medicine

## 2020-05-08 DIAGNOSIS — N3000 Acute cystitis without hematuria: Secondary | ICD-10-CM | POA: Insufficient documentation

## 2020-05-08 DIAGNOSIS — R300 Dysuria: Secondary | ICD-10-CM | POA: Diagnosis not present

## 2020-05-08 LAB — POCT URINALYSIS DIP (MANUAL ENTRY)
Bilirubin, UA: NEGATIVE
Blood, UA: NEGATIVE
Glucose, UA: NEGATIVE mg/dL
Ketones, POC UA: NEGATIVE mg/dL
Nitrite, UA: POSITIVE — AB
Protein Ur, POC: 30 mg/dL — AB
Spec Grav, UA: 1.025 (ref 1.010–1.025)
Urobilinogen, UA: 0.2 E.U./dL
pH, UA: 6.5 (ref 5.0–8.0)

## 2020-05-08 LAB — POCT URINE PREGNANCY: Preg Test, Ur: NEGATIVE

## 2020-05-08 MED ORDER — NITROFURANTOIN MONOHYD MACRO 100 MG PO CAPS: 100.0000 mg | ORAL_CAPSULE | Freq: Two times a day (BID) | ORAL | 0 refills | Status: DC

## 2020-05-08 MED ORDER — PHENAZOPYRIDINE HCL 200 MG PO TABS: 200.0000 mg | ORAL_TABLET | Freq: Three times a day (TID) | ORAL | 0 refills | Status: DC

## 2020-05-08 NOTE — Discharge Instructions (Signed)
Declines STD testing at this time Urine concerning for UTI Urine culture sent.  We will call you with the results.   Push fluids and get plenty of rest.   Take antibiotic as directed and to completion Follow up with PCP if symptoms persists Return here or go to ER if you have any new or worsening symptoms such as fever, worsening abdominal pain, nausea/vomiting, flank pain, etc..Marland Kitchen

## 2020-05-08 NOTE — ED Provider Notes (Signed)
[facility]   CC: Burning with urination  SUBJECTIVE:  Gina Roman is a 19 y.o. female who complains of urinary frequency, urgency and dysuria for the past 1.5 weeks.  Patient denies a precipitating event, recent sexual encounter, excessive caffeine intake.  Has NOT tried OTC medications.  Symptoms are made worse with urination.  Admits to similar symptoms in the past.  Complains of mild abdominal discomfort.   Denies fever, chills, nausea, vomiting, flank pain, abnormal vaginal discharge or bleeding, hematuria.    LMP: No LMP recorded (lmp unknown). (Menstrual status: IUD).  ROS: As in HPI.  All other pertinent ROS negative.     Past Medical History:  Diagnosis Date  . BV (bacterial vaginosis) 10/10/2019  . Constipation   . Kidney infection    Past Surgical History:  Procedure Laterality Date  . NO PAST SURGERIES     No Known Allergies No current facility-administered medications on file prior to encounter.   No current outpatient medications on file prior to encounter.   Social History   Socioeconomic History  . Marital status: Single    Spouse name: Not on file  . Number of children: 1  . Years of education: Not on file  . Highest education level: Not on file  Occupational History  . Not on file  Tobacco Use  . Smoking status: Passive Smoke Exposure - Never Smoker  . Smokeless tobacco: Never Used  Substance and Sexual Activity  . Alcohol use: No  . Drug use: No  . Sexual activity: Yes    Birth control/protection: None, I.U.D.  Other Topics Concern  . Not on file  Social History Narrative  . Not on file   Social Determinants of Health   Financial Resource Strain:   . Difficulty of Paying Living Expenses:   Food Insecurity:   . Worried About Programme researcher, broadcasting/film/video in the Last Year:   . Barista in the Last Year:   Transportation Needs:   . Freight forwarder (Medical):   Marland Kitchen Lack of Transportation (Non-Medical):   Physical Activity:    . Days of Exercise per Week:   . Minutes of Exercise per Session:   Stress:   . Feeling of Stress :   Social Connections:   . Frequency of Communication with Friends and Family:   . Frequency of Social Gatherings with Friends and Family:   . Attends Religious Services:   . Active Member of Clubs or Organizations:   . Attends Banker Meetings:   Marland Kitchen Marital Status:   Intimate Partner Violence:   . Fear of Current or Ex-Partner:   . Emotionally Abused:   Marland Kitchen Physically Abused:   . Sexually Abused:    History reviewed. No pertinent family history.  OBJECTIVE:  Vitals:   05/08/20 1147  BP: 110/71  Pulse: 89  Resp: 16  Temp: 98.4 F (36.9 C)  SpO2: 98%   General appearance: Alert in no acute distress HEENT: NCAT.  Oropharynx clear.  Lungs: clear to auscultation bilaterally without adventitious breath sounds Heart: regular rate and rhythm.   Abdomen: soft; non-distended; no tenderness; bowel sounds present; no guarding Back: no CVA tenderness GU: Declines Extremities: no edema; symmetrical with no gross deformities Skin: warm and dry Neurologic: Ambulates from chair to exam table without difficulty Psychological: alert and cooperative; normal mood and affect  Labs Reviewed  POCT URINALYSIS DIP (MANUAL ENTRY) - Abnormal; Notable for the following components:      Result  Value   Clarity, UA cloudy (*)    Protein Ur, POC =30 (*)    Nitrite, UA Positive (*)    Leukocytes, UA Small (1+) (*)    All other components within normal limits  URINE CULTURE  POCT URINE PREGNANCY    ASSESSMENT & PLAN:  1. Dysuria   2. Acute cystitis without hematuria     Meds ordered this encounter  Medications  . nitrofurantoin, macrocrystal-monohydrate, (MACROBID) 100 MG capsule    Sig: Take 1 capsule (100 mg total) by mouth 2 (two) times daily.    Dispense:  10 capsule    Refill:  0    Order Specific Question:   Supervising Provider    Answer:   Raylene Everts  [[id number]]  . phenazopyridine (PYRIDIUM) 200 MG tablet    Sig: Take 1 tablet (200 mg total) by mouth 3 (three) times daily.    Dispense:  6 tablet    Refill:  0    Order Specific Question:   Supervising Provider    Answer:   Raylene Everts [[id number]]   Declines STD testing at this time Urine concerning for UTI Urine culture sent.  We will call you with the results.   Push fluids and get plenty of rest.   Take antibiotic as directed and to completion Follow up with PCP if symptoms persists Return here or go to ER if you have any new or worsening symptoms such as fever, worsening abdominal pain, nausea/vomiting, flank pain, etc...  Outlined signs and symptoms indicating need for more acute intervention. Patient verbalized understanding. After Visit Summary given.     Lestine Box, PA-C 05/08/20 1204

## 2020-05-08 NOTE — ED Triage Notes (Signed)
Low abd pain, low back pain, dysuria and foul smelling urine x 1.5 weeks

## 2020-05-10 LAB — URINE CULTURE
Culture: >=100000 — AB
Special Requests: NORMAL

## 2020-06-27 DIAGNOSIS — H5213 Myopia, bilateral: Secondary | ICD-10-CM | POA: Diagnosis not present

## 2020-07-31 ENCOUNTER — Ambulatory Visit
Admission: EM | Admit: 2020-07-31 | Discharge: 2020-07-31 | Disposition: A | Discharge status: Home / Self Care | Payer: Medicaid Other

## 2020-07-31 ENCOUNTER — Other Ambulatory Visit: Payer: Self-pay

## 2020-08-14 DIAGNOSIS — Z309 Encounter for contraceptive management, unspecified: Secondary | ICD-10-CM | POA: Diagnosis not present

## 2020-08-14 DIAGNOSIS — K7689 Other specified diseases of liver: Secondary | ICD-10-CM | POA: Diagnosis not present

## 2020-08-29 ENCOUNTER — Ambulatory Visit: Payer: Medicaid Other | Admitting: Advanced Practice Midwife

## 2020-08-31 ENCOUNTER — Encounter: Payer: Self-pay | Admitting: Women's Health

## 2020-08-31 ENCOUNTER — Ambulatory Visit (INDEPENDENT_AMBULATORY_CARE_PROVIDER_SITE_OTHER): Payer: Medicaid Other | Admitting: Women's Health

## 2020-08-31 VITALS — BP 108/70 | HR 81 | Ht 64.0 in | Wt 153.0 lb

## 2020-08-31 DIAGNOSIS — Z3202 Encounter for pregnancy test, result negative: Secondary | ICD-10-CM | POA: Diagnosis not present

## 2020-08-31 DIAGNOSIS — Z30431 Encounter for routine checking of intrauterine contraceptive device: Secondary | ICD-10-CM

## 2020-08-31 NOTE — Progress Notes (Signed)
   GYN VISIT Patient name: Gina Roman MRN [id number]  Date of birth: [birth year] Chief Complaint:   IUD Removal (Discuss Birth Control)  History of Present Illness:   Gina Roman is a 18 y.o. G1P0 Caucasian female being seen today initially for IUD removal.  Went to PCP and they were concerned she wasn't having periods. Pt thought she had a copper IUD placed in 2019 at her pp visit in [place]. Pulled pp note/IUD insert note from [facility] on 05/17/18 and Mirena was inserted. Pt wants me to look at IUD to make sure.    No flowsheet data found.  No LMP recorded. (Menstrual status: IUD). The current method of family planning is IUD.  Last pap <21yo. Results were:  n/a Review of Systems:   Pertinent items are noted in HPI Denies fever/chills, dizziness, headaches, visual disturbances, fatigue, shortness of breath, chest pain, abdominal pain, vomiting, abnormal vaginal discharge/itching/odor/irritation, problems with periods, bowel movements, urination, or intercourse unless otherwise stated above.  Pertinent History Reviewed:  Reviewed past medical,surgical, social, obstetrical and family history.  Reviewed problem list, medications and allergies. Physical Assessment:   Vitals:   08/31/20 1226  BP: 108/70  Pulse: 81  Weight: 153 lb (69.4 kg)  Height: 5\' 4"  (1.626 m)  Body mass index is 26.26 kg/m.       Physical Examination:   General appearance: alert, well appearing, and in no distress  Mental status: alert, oriented to person, place, and time  Skin: warm & dry   Cardiovascular: normal heart rate noted  Respiratory: normal respiratory effort, no distress  Abdomen: soft, non-tender   Pelvic: VULVA: normal appearing vulva with no masses, tenderness or lesions, VAGINA: normal appearing vagina with normal color and discharge, no lesions, CERVIX: normal appearing cervix without discharge or lesions, silver strings visible (consistent w/ Mirena), strings appropriate  length  Extremities: no edema   Chaperone: Amanda Rash    No results found for this or any previous visit (from the past 24 hour(s)).  Assessment & Plan:  1) IUD check> has Mirena, discussed it can shorten/lighten periods and they may even stop, so it is completely normal she is not having periods. Will need removal in 2025  Meds: No orders of the defined types were placed in this encounter.   Orders Placed This Encounter  Procedures  . POCT urine pregnancy    Return for @ 20yo for pap & physical.  2026 CNM, WHNP-BC 08/31/2020 1:12 PM

## 2020-09-16 ENCOUNTER — Ambulatory Visit
Admission: EM | Admit: 2020-09-16 | Discharge: 2020-09-16 | Disposition: A | Discharge status: Home / Self Care | Payer: Medicaid Other | Attending: Emergency Medicine | Admitting: Emergency Medicine

## 2020-09-16 ENCOUNTER — Emergency Department (HOSPITAL_COMMUNITY)
Admission: EM | Admit: 2020-09-16 | Discharge: 2020-09-16 | Disposition: A | Discharge status: Home / Self Care | Payer: Medicaid Other | Attending: Emergency Medicine | Admitting: Emergency Medicine

## 2020-09-16 ENCOUNTER — Other Ambulatory Visit: Payer: Self-pay

## 2020-09-16 ENCOUNTER — Encounter (HOSPITAL_COMMUNITY): Payer: Self-pay | Admitting: Emergency Medicine

## 2020-09-16 DIAGNOSIS — N730 Acute parametritis and pelvic cellulitis: Secondary | ICD-10-CM

## 2020-09-16 DIAGNOSIS — R102 Pelvic and perineal pain: Secondary | ICD-10-CM | POA: Diagnosis not present

## 2020-09-16 DIAGNOSIS — N739 Female pelvic inflammatory disease, unspecified: Secondary | ICD-10-CM | POA: Diagnosis not present

## 2020-09-16 DIAGNOSIS — R1031 Right lower quadrant pain: Secondary | ICD-10-CM | POA: Insufficient documentation

## 2020-09-16 LAB — COMPREHENSIVE METABOLIC PANEL
ALT: 10 U/L (ref 0–44)
AST: 13 U/L — ABNORMAL LOW (ref 15–41)
Albumin: 4.8 g/dL (ref 3.5–5.0)
Alkaline Phosphatase: 28 U/L — ABNORMAL LOW (ref 38–126)
Anion gap: 12 (ref 5–15)
BUN: 9 mg/dL (ref 6–20)
CO2: 22 mmol/L (ref 22–32)
Calcium: 9.2 mg/dL (ref 8.9–10.3)
Chloride: 104 mmol/L (ref 98–111)
Creatinine, Ser: 0.65 mg/dL (ref 0.44–1.00)
GFR, Estimated: >60 mL/min (ref >60)
Glucose, Bld: 87 mg/dL (ref 70–99)
Potassium: 3.7 mmol/L (ref 3.5–5.1)
Sodium: 138 mmol/L (ref 135–145)
Total Bilirubin: 2.3 mg/dL — ABNORMAL HIGH (ref 0.3–1.2)
Total Protein: 7.6 g/dL (ref 6.5–8.1)

## 2020-09-16 LAB — CBC
HCT: 42.2 % (ref 36.0–46.0)
Hemoglobin: 14 g/dL (ref 12.0–15.0)
MCH: 29.7 pg (ref 26.0–34.0)
MCHC: 33.2 g/dL (ref 30.0–36.0)
MCV: 89.6 fL (ref 80.0–100.0)
Platelets: 210 10*3/uL (ref 150–400)
RBC: 4.71 MIL/uL (ref 3.87–5.11)
RDW: 12.6 % (ref 11.5–15.5)
WBC: 7.9 10*3/uL (ref 4.0–10.5)
nRBC: 0 % (ref 0.0–0.2)

## 2020-09-16 LAB — WET PREP, GENITAL
Clue Cells Wet Prep HPF POC: NONE SEEN
Sperm: NONE SEEN
Trich, Wet Prep: NONE SEEN
Yeast Wet Prep HPF POC: NONE SEEN

## 2020-09-16 LAB — URINALYSIS, ROUTINE W REFLEX MICROSCOPIC
Bilirubin Urine: NEGATIVE
Glucose, UA: NEGATIVE mg/dL
Ketones, ur: NEGATIVE mg/dL
Nitrite: NEGATIVE
Protein, ur: NEGATIVE mg/dL
Specific Gravity, Urine: 1.014 (ref 1.005–1.030)
pH: 6 (ref 5.0–8.0)

## 2020-09-16 LAB — POCT URINALYSIS DIP (MANUAL ENTRY)
Bilirubin, UA: NEGATIVE
Glucose, UA: NEGATIVE mg/dL
Ketones, POC UA: NEGATIVE mg/dL
Nitrite, UA: NEGATIVE
Protein Ur, POC: NEGATIVE mg/dL
Spec Grav, UA: 1.01 (ref 1.010–1.025)
Urobilinogen, UA: 0.2 E.U./dL
pH, UA: 7 (ref 5.0–8.0)

## 2020-09-16 LAB — POCT URINE PREGNANCY: Preg Test, Ur: NEGATIVE

## 2020-09-16 LAB — POC URINE PREG, ED: Preg Test, Ur: NEGATIVE

## 2020-09-16 LAB — LIPASE, BLOOD: Lipase: 35 U/L (ref 11–51)

## 2020-09-16 MED ORDER — CEFTRIAXONE SODIUM 500 MG IJ SOLR
500.0000 mg | Freq: Once | INTRAMUSCULAR | Status: AC
  Administered 2020-09-16: 500 mg via INTRAMUSCULAR
  Filled 2020-09-16: qty 500

## 2020-09-16 MED ORDER — DEXTROSE 5 % IV SOLN
500.0000 mg | Freq: Once | INTRAVENOUS | Status: DC
  Filled 2020-09-16: qty 500

## 2020-09-16 MED ORDER — DOXYCYCLINE HYCLATE 100 MG PO CAPS: 100.0000 mg | ORAL_CAPSULE | Freq: Two times a day (BID) | ORAL | 0 refills | Status: AC

## 2020-09-16 MED ORDER — DOXYCYCLINE HYCLATE 100 MG PO TABS: 100.0000 mg | ORAL_TABLET | Freq: Once | ORAL | Status: DC

## 2020-09-16 MED ORDER — METRONIDAZOLE 500 MG PO TABS
500.0000 mg | ORAL_TABLET | Freq: Once | ORAL | Status: AC
  Administered 2020-09-16: 500 mg via ORAL
  Filled 2020-09-16: qty 1

## 2020-09-16 MED ORDER — ONDANSETRON 4 MG PO TBDP: 4.0000 mg | ORAL_TABLET | Freq: Three times a day (TID) | ORAL | 0 refills | Status: DC | PRN

## 2020-09-16 MED ORDER — DOXYCYCLINE HYCLATE 100 MG PO TABS
100.0000 mg | ORAL_TABLET | Freq: Once | ORAL | Status: AC
  Administered 2020-09-16: 100 mg via ORAL
  Filled 2020-09-16: qty 1

## 2020-09-16 MED ORDER — ONDANSETRON 4 MG PO TBDP
4.0000 mg | ORAL_TABLET | Freq: Once | ORAL | Status: AC
  Administered 2020-09-16: 4 mg via ORAL
  Filled 2020-09-16: qty 1

## 2020-09-16 MED ORDER — LIDOCAINE HCL (PF) 1 % IJ SOLN
INTRAMUSCULAR | Status: AC
  Administered 2020-09-16: 1 mL
  Filled 2020-09-16: qty 30

## 2020-09-16 MED ORDER — METRONIDAZOLE 500 MG PO TABS: 500.0000 mg | ORAL_TABLET | Freq: Two times a day (BID) | ORAL | 0 refills | Status: AC

## 2020-09-16 NOTE — ED Provider Notes (Signed)
[facility]   [id number] 09/16/20 Arrival Time: 1251  CC: ABDOMINAL DISCOMFORT  SUBJECTIVE:  Gina Roman is a 19 y.o. female who presents with complaint of abdominal discomfort that began abruptly this morning.  Was setting up front a seated position.  Localizes pain to suprapubic region.  Describes as constant and sharp in character.  Has not tried OTC medications.  Worse with walking.  Denies similar symptoms in the past.  Complains of associated pelvic pain.    Denies fever, chills, nausea, vomiting, chest pain, SOB, diarrhea, constipation, hematochezia, melena, dysuria, difficulty urinating, increased frequency or urgency, flank pain, loss of bowel or bladder function, vaginal discharge, vaginal odor, vaginal bleeding, dyspareunia.    No LMP recorded. (Menstrual status: IUD).  ROS: As per HPI.  All other pertinent ROS negative.     Past Medical History:  Diagnosis Date   BV (bacterial vaginosis) 10/10/2019   Constipation    Kidney infection    Past Surgical History:  Procedure Laterality Date   NO PAST SURGERIES     No Known Allergies No current facility-administered medications on file prior to encounter.   No current outpatient medications on file prior to encounter.   Social History   Socioeconomic History   Marital status: Single    Spouse name: Not on file   Number of children: 1   Years of education: Not on file   Highest education level: Not on file  Occupational History   Not on file  Tobacco Use   Smoking status: Passive Smoke Exposure - Never Smoker   Smokeless tobacco: Never Used  Vaping Use   Vaping Use: Never used  Substance and Sexual Activity   Alcohol use: No   Drug use: No   Sexual activity: Not Currently    Birth control/protection: None, I.U.D.  Other Topics Concern   Not on file  Social History Narrative   Not on file   Social Determinants of Health   Financial Resource Strain:    Difficulty of  Paying Living Expenses: Not on file  Food Insecurity:    Worried About Running Out of Food in the Last Year: Not on file   Ran Out of Food in the Last Year: Not on file  Transportation Needs:    Lack of Transportation (Medical): Not on file   Lack of Transportation (Non-Medical): Not on file  Physical Activity:    Days of Exercise per Week: Not on file   Minutes of Exercise per Session: Not on file  Stress:    Feeling of Stress : Not on file  Social Connections:    Frequency of Communication with Friends and Family: Not on file   Frequency of Social Gatherings with Friends and Family: Not on file   Attends Religious Services: Not on file   Active Member of Clubs or Organizations: Not on file   Attends Banker Meetings: Not on file   Marital Status: Not on file  Intimate Partner Violence:    Fear of Current or Ex-Partner: Not on file   Emotionally Abused: Not on file   Physically Abused: Not on file   Sexually Abused: Not on file   No family history on file.   OBJECTIVE:  Vitals:   09/16/20 1316  BP: 116/80  Pulse: 95  Resp: 16  Temp: 98.6 F (37 C)  TempSrc: Oral  SpO2: 98%    General appearance: Alert; appears uncomfortable HEENT: NCAT.  Oropharynx clear.  Lungs: clear to auscultation bilaterally without  adventitious breath sounds Heart: regular rate and rhythm.   Abdomen: soft, non-distended; normal active bowel sounds; TTP over RLQ and suprapubic region; tender at McBurney's point; negative Murphy's sign; +guarding Back: no CVA tenderness Extremities: no edema; symmetrical with no gross deformities Skin: warm and dry Neurologic: ambulates slowly Psychological: alert and cooperative; normal mood and affect  LABS: Results for orders placed or performed during the hospital encounter of 09/16/20 (from the past 24 hour(s))  POCT urine pregnancy     Status: None   Collection Time: 09/16/20  1:28 PM  Result Value Ref Range   Preg Test,  Ur Negative Negative  POCT urinalysis dipstick     Status: Abnormal   Collection Time: 09/16/20  1:28 PM  Result Value Ref Range   Color, UA yellow yellow   Clarity, UA clear clear   Glucose, UA negative negative mg/dL   Bilirubin, UA negative negative   Ketones, POC UA negative negative mg/dL   Spec Grav, UA 0.109 3.235 - 1.025   Blood, UA trace-intact (A) negative   pH, UA 7.0 5.0 - 8.0   Protein Ur, POC negative negative mg/dL   Urobilinogen, UA 0.2 0.2 or 1.0 E.U./dL   Nitrite, UA Negative Negative   Leukocytes, UA Small (1+) (A) Negative    ASSESSMENT & PLAN:  1. Right lower quadrant pain   2. Suprapubic pain    Urine with trace blood and WBC.    Unable to rule out appendicitis, ovarian cyst, ovarian torsion, TOA, PID, etc...  in urgent care setting.  Offered patient further evaluation and management in the ED.  Patient agreeable.  Will travel by private vehicle.     Rennis Harding, PA-C 09/16/20 1342

## 2020-09-16 NOTE — ED Provider Notes (Signed)
[facility] EMERGENCY DEPARTMENT Provider Note   CSN: [id number] Arrival date & time: 09/16/20  1351     History Chief Complaint  Patient presents with  . Abdominal Pain    Gina Roman is a 19 y.o. female.  HPI 19 year old female with history of BV presents to the ER with complaints of sharp right-sided lower pelvic pain onset earlier today.  Patient states that she laid back on the bed earlier this morning when attempting to put her pants on and felt a sharp stabbing pain in her pelvic area.  She states that she had to lay in her bed for approximate hour for the pain to pass.  She also states that her toddler accidentally kicked her in the pelvic area and this exacerbated her pain.  She denies any nausea, vomiting, fevers, vaginal discharge, bleeding, dysuria.  She states that she then went to urgent care to be evaluated and they felt that she needed to be seen in the ER for rule out of appendicitis.  Per chart review, urgent care noted at McBurney's point, negative Murphy's. Pt has her appendix. She states her last sexual encounter was approximately a month ago without protection.     Past Medical History:  Diagnosis Date  . BV (bacterial vaginosis) 10/10/2019  . Constipation   . Kidney infection     Patient Active Problem List   Diagnosis Date Noted  . Fatigue 01/04/2020  . BV (bacterial vaginosis) 10/10/2019  . Chlamydia 01/31/2019  . Encounter for insertion of intrauterine contraceptive device (IUD) 05/17/2018    Past Surgical History:  Procedure Laterality Date  . NO PAST SURGERIES       OB History    Gravida  2   Para  1   Term  1   Preterm      AB      Living  1     SAB      TAB      Ectopic      Multiple  1   Live Births              No family history on file.  Social History   Tobacco Use  . Smoking status: Passive Smoke Exposure - Never Smoker  . Smokeless tobacco: Never Used  Vaping Use  . Vaping Use: Never used   Substance Use Topics  . Alcohol use: No  . Drug use: No    Home Medications Prior to Admission medications   Medication Sig Start Date End Date Taking? Authorizing Provider  doxycycline (VIBRAMYCIN) 100 MG capsule Take 1 capsule (100 mg total) by mouth 2 (two) times daily for 14 days. 09/16/20 09/30/20  Mare Ferrari, PA-C  metroNIDAZOLE (FLAGYL) 500 MG tablet Take 1 tablet (500 mg total) by mouth 2 (two) times daily for 14 days. 09/16/20 09/30/20  Mare Ferrari, PA-C  ondansetron (ZOFRAN ODT) 4 MG disintegrating tablet Take 1 tablet (4 mg total) by mouth every 8 (eight) hours as needed for nausea or vomiting. 09/16/20   Mare Ferrari, PA-C    Allergies    Patient has no known allergies.  Review of Systems   Review of Systems  Constitutional: Negative for chills and fever.  HENT: Negative for ear pain and sore throat.   Eyes: Negative for pain and visual disturbance.  Respiratory: Negative for cough and shortness of breath.   Cardiovascular: Negative for chest pain and palpitations.  Gastrointestinal: Positive for abdominal pain. Negative for vomiting.  Genitourinary:  Positive for pelvic pain. Negative for decreased urine volume, dysuria, flank pain, genital sores, hematuria, vaginal bleeding, vaginal discharge and vaginal pain.  Musculoskeletal: Negative for arthralgias and back pain.  Skin: Negative for color change and rash.  Neurological: Negative for seizures, syncope and headaches.  All other systems reviewed and are negative.   Physical Exam Updated Vital Signs BP 120/75 (BP Location: Right Arm)   Pulse 70   Temp 98.1 F (36.7 C) (Oral)   Resp 18   Ht 5\' 4"  (1.626 m)   Wt 68 kg   SpO2 100%   BMI 25.75 kg/m   Physical Exam Vitals and nursing note reviewed.  Constitutional:      General: She is not in acute distress.    Appearance: She is well-developed. She is not ill-appearing, toxic-appearing or diaphoretic.  HENT:     Head: Normocephalic and  atraumatic.  Eyes:     Conjunctiva/sclera: Conjunctivae normal.  Cardiovascular:     Rate and Rhythm: Normal rate and regular rhythm.     Heart sounds: Normal heart sounds. No murmur heard.   Pulmonary:     Effort: Pulmonary effort is normal. No respiratory distress.     Breath sounds: Normal breath sounds.  Abdominal:     Palpations: Abdomen is soft.     Tenderness: There is no abdominal tenderness. Negative signs include Murphy's sign and McBurney's sign.     Comments: Mcburney's point negative. Pt has mild/moderate TTP to the right lower pelvis. No flank tenderness, negative Murphy's  Genitourinary:    Cervix: Cervical motion tenderness present.     Adnexa: Right adnexa normal.       Right: No tenderness.         Left: No tenderness.       Rectum: Normal.     Comments: Pelvic exam performed with chaperone present.  Patient with copious greenish discharge in vaginal vault and cervix.  No noticeable bleeding.  She did have CMT tenderness on exam.  No adnexal tenderness bilaterally.No uterine tenderness Musculoskeletal:     Cervical back: Neck supple.  Skin:    General: Skin is warm and dry.  Neurological:     Mental Status: She is alert.     ED Results / Procedures / Treatments   Labs (all labs ordered are listed, but only abnormal results are displayed) Labs Reviewed  WET PREP, GENITAL - Abnormal; Notable for the following components:      Result Value   WBC, Wet Prep HPF POC MANY (*)    All other components within normal limits  COMPREHENSIVE METABOLIC PANEL - Abnormal; Notable for the following components:   AST 13 (*)    Alkaline Phosphatase 28 (*)    Total Bilirubin 2.3 (*)    All other components within normal limits  URINALYSIS, ROUTINE W REFLEX MICROSCOPIC - Abnormal; Notable for the following components:   APPearance HAZY (*)    Hgb urine dipstick SMALL (*)    Leukocytes,Ua MODERATE (*)    Bacteria, UA FEW (*)    All other components within normal limits   URINE CULTURE  LIPASE, BLOOD  CBC  POC URINE PREG, ED  GC/CHLAMYDIA PROBE AMP ([place]) NOT AT [facility]    EKG None  Radiology No results found.  Procedures Procedures (including critical care time)  Medications Ordered in ED Medications  cefTRIAXone (ROCEPHIN) injection 500 mg (has no administration in time range)  metroNIDAZOLE (FLAGYL) tablet 500 mg (has no administration in time range)  ondansetron (  ZOFRAN-ODT) disintegrating tablet 4 mg (has no administration in time range)  lidocaine (PF) (XYLOCAINE) 1 % injection (has no administration in time range)  doxycycline (VIBRA-TABS) tablet 100 mg (100 mg Oral Given 09/16/20 1706)    ED Course  I have reviewed the triage vital signs and the nursing notes.  Pertinent labs & imaging results that were available during my care of the patient were reviewed by me and considered in my medical decision making (see chart for details).    MDM Rules/Calculators/A&P                         19 year old female with an onset of right lower quadrant/pelvic pain.  She is afebrile, vitals reassuring.  Negative Murphy sign and McBurney's point on my exam.  She does have some mild to moderate tenderness to palpation in the right lower pelvic area.  Pelvic exam with green copious discharge with cervical motion tenderness.  No adnexal tenderness bilaterally, no uterine tenderness.  I examined her abdomen multiple times and pressed on McBurney's point which the patient states she had no pain.  She had negative rebound, negative psoas.  She has no flank tenderness.  Her CBC was without leukocytosis, she does have an elevated total bilirubin of 2.3, however it appears that she has a baseline elevated bilirubin as lab values were 1.4 several years ago. Lipase normal.   Her wet prep does not show any evidence of yeast, trichomoniasis, though she does have many WBCs.  Her UA does have moderate leukocytes, however patient denies any dysuria.  She does not have  any flank tenderness, doubt pyelonephritis.  On reevaluation, patient notes that her pain has significantly improved.  I again examined her abdomen and she no longer endorses right lower quadrant pain after the pelvic exam.  Given negative special tests for appendicitis, no white count, afebrile, my suspicion for appendicitis is low.  She has no adnexal tenderness, suspicion for ovarian torsion, cyst, ectopic pregnancy is low.  Pregnancy is negative.  I discussed the pros and cons of CT scan, explained to the patient that her exam was overall reassuring along with her lab work.  The CMT suggestive of PID could explain her symptoms.  She would like to forego CT scan at this time as she needs to get to work.  I encouraged the patient to return to the ER for further evaluation if her symptoms continue, which she voiced understanding and is agreeable.  Patient was treated here with 500 mg IM Rocephin, 100mg  Doxy and 500mg  flagyl. Pt will be sent home with 100mg  of doxy and flagyl for 14 days. Zofran sent. Pt instructed to abstain from alcohol.  Pt was instructed to abstain for sex and have all partners tested. She voiced understanding and is agreeable.  Stable for discharge with strict return precautions, encouraged OB/GYN follow-up.  Final Clinical Impression(s) / ED Diagnoses Final diagnoses:  PID (acute pelvic inflammatory disease)    Rx / DC Orders ED Discharge Orders         Ordered    doxycycline (VIBRAMYCIN) 100 MG capsule  2 times daily        09/16/20 1733    metroNIDAZOLE (FLAGYL) 500 MG tablet  2 times daily        09/16/20 1733    ondansetron (ZOFRAN ODT) 4 MG disintegrating tablet  Every 8 hours PRN        09/16/20 1734  Mare FerrariBelaya, Govanni Plemons A, PA-C 09/16/20 1740    Pricilla LovelessGoldston, Scott, MD 09/17/20 [id number]

## 2020-09-16 NOTE — ED Triage Notes (Signed)
Patient c/o abd pain x4 hours with nausea but no vomiting, diarrhea, or fevers. Patient seen at Urgent Care and sent here to ED due to increased pain in right lower quad with palpitation for possible appendicitis. Per patient last normal BM x2 days ago. Denies any blood in stool.

## 2020-09-16 NOTE — Discharge Instructions (Addendum)
Urine with trace blood and WBC.    Unable to rule out appendicitis, ovarian cyst, ovarian torsion, TOA, PID, etc...  in urgent care setting.  Offered patient further evaluation and management in the ED.  Patient agreeable.  Will travel by private vehicle.

## 2020-09-16 NOTE — Discharge Instructions (Addendum)
Do not have sex for 2 weeks Have all partners tested and treated If your test is abnormal, you will be called but you have been treated for Gonorrhea and Chlamydia today. You can also review your results on MyChart Practice safe sex and use a condom to prevent infection or unwanted pregnancy Please take the antibiotics as directed until finished.  The metronidazole (Flagyl) may make you nausesous, do not drink alcohol with this medication. I have sent in some nausea medication if needed  As discussed, we will not proceed with a CT scan today. However, if your symptoms continue, please return to the ER to be evaluated immediately.   Please follow up with your OBGYN

## 2020-09-16 NOTE — ED Triage Notes (Signed)
Provider triage and DC  

## 2020-09-17 ENCOUNTER — Telehealth: Payer: Self-pay | Admitting: *Deleted

## 2020-09-17 LAB — URINE CULTURE: Culture: <10000 — AB

## 2020-09-17 LAB — GC/CHLAMYDIA PROBE AMP (~~LOC~~) NOT AT ARMC
Chlamydia: NEGATIVE
Comment: NEGATIVE
Comment: NORMAL
Neisseria Gonorrhea: NEGATIVE

## 2020-09-17 NOTE — Telephone Encounter (Signed)
Transition Care Management Unsuccessful Follow-up Telephone Call  Date of discharge and from where:  09/16/20 - [facility]  Attempts:  1st Attempt  Reason for unsuccessful TCM follow-up call:  Unable to reach patient

## 2020-09-18 ENCOUNTER — Telehealth: Payer: Self-pay | Admitting: Adult Health

## 2020-09-18 ENCOUNTER — Other Ambulatory Visit: Payer: Self-pay | Admitting: Women's Health

## 2020-09-18 LAB — URINE CULTURE

## 2020-09-18 MED ORDER — FLUCONAZOLE 150 MG PO TABS: 150.0000 mg | ORAL_TABLET | Freq: Once | ORAL | 0 refills | Status: AC

## 2020-09-18 NOTE — Telephone Encounter (Signed)
Pt advised to take Doxy and Flagyl as prescribed and Diflucan has been sent to pharmacy to cover yeast. Pt voiced understanding. JSY

## 2020-09-18 NOTE — Telephone Encounter (Signed)
Patient states that she went to ER on the 17 and they prescribed her antibiotic for Std but claims to not have std and she said the medicine they told her to take caused yeast infection. She stated she reached out to hospital to see if they will treat the yeast infection and they 'declined' patient would like to have medication sent to pharmacy for yeast symptoms. Please advise if patient needs f/u appointment from ed visit.

## 2020-09-19 ENCOUNTER — Telehealth: Payer: Self-pay | Admitting: *Deleted

## 2020-09-19 NOTE — Telephone Encounter (Signed)
Message will be closed as we are outside of the 72 hour window to contact patient.  °

## 2020-09-19 NOTE — Telephone Encounter (Signed)
Called patient to assist with follow up ,she states  She will call her pcp and schedule the appointment . Shannan Slinker   PEC [phone]

## 2020-10-03 ENCOUNTER — Ambulatory Visit (INDEPENDENT_AMBULATORY_CARE_PROVIDER_SITE_OTHER): Payer: Medicaid Other | Admitting: Student

## 2020-10-03 ENCOUNTER — Encounter: Payer: Self-pay | Admitting: Student

## 2020-10-03 VITALS — BP 108/71 | HR 82 | Wt 155.0 lb

## 2020-10-03 DIAGNOSIS — Z30432 Encounter for removal of intrauterine contraceptive device: Secondary | ICD-10-CM

## 2020-10-03 MED ORDER — NORGESTIM-ETH ESTRAD TRIPHASIC 0.18/0.215/0.25 MG-35 MCG PO TABS: 1.0000 | ORAL_TABLET | Freq: Every day | ORAL | 11 refills | Status: DC

## 2020-10-03 NOTE — Progress Notes (Signed)
Patient ID: Gina Roman, female   DOB: June 28, 2001, 19 y.o.   MRN: [id number]     GYNECOLOGY OFFICE PROCEDURE NOTE  Gina Roman is a 19 y.o. G1P1001 here for Liletta IUD removal. No GYN concerns.  Patient is too young for pap smear. Patient does not want any more children and is not sexually active. She is very clear that she wants to try pills, has been on pills in the past and says that she can take them regularly.   IUD Removal  Patient identified, informed consent performed, consent signed.  Patient was in the dorsal lithotomy position, normal external genitalia was noted.  A speculum was placed in the patient's vagina, normal discharge was noted, no lesions. The cervix was visualized, no lesions, no abnormal discharge.  The strings of the IUD were grasped and pulled using ring forceps. The IUD was removed in its entirety.  Patient tolerated the procedure well.    Patient will use birth control pills for contraception/  Routine preventative health maintenance measures emphasized.  -Rx given for Sprintec.   Luna Kitchens  [facility] for [employer], [facility]

## 2020-10-23 DIAGNOSIS — Z309 Encounter for contraceptive management, unspecified: Secondary | ICD-10-CM | POA: Diagnosis not present

## 2020-10-23 DIAGNOSIS — K7689 Other specified diseases of liver: Secondary | ICD-10-CM | POA: Diagnosis not present

## 2020-12-26 ENCOUNTER — Other Ambulatory Visit: Payer: Self-pay

## 2020-12-26 ENCOUNTER — Ambulatory Visit
Admission: RE | Admit: 2020-12-26 | Discharge: 2020-12-26 | Disposition: A | Discharge status: Home / Self Care | Payer: Medicaid Other | Source: Ambulatory Visit | Attending: Family Medicine | Admitting: Family Medicine

## 2020-12-26 VITALS — BP 101/65 | HR 86 | Temp 98.6°F | Resp 19

## 2020-12-26 DIAGNOSIS — N898 Other specified noninflammatory disorders of vagina: Secondary | ICD-10-CM

## 2020-12-26 DIAGNOSIS — R350 Frequency of micturition: Secondary | ICD-10-CM

## 2020-12-26 DIAGNOSIS — R300 Dysuria: Secondary | ICD-10-CM

## 2020-12-26 LAB — POCT URINALYSIS DIP (MANUAL ENTRY)
Bilirubin, UA: NEGATIVE
Glucose, UA: NEGATIVE mg/dL
Ketones, POC UA: NEGATIVE mg/dL
Nitrite, UA: NEGATIVE
Protein Ur, POC: NEGATIVE mg/dL
Spec Grav, UA: 1.01 (ref 1.010–1.025)
Urobilinogen, UA: 0.2 E.U./dL
pH, UA: 7 (ref 5.0–8.0)

## 2020-12-26 NOTE — Discharge Instructions (Signed)
You may have a urinary tract infection.   We are going to culture your urine and will call you as soon as we have the results.   Drink plenty of water, 8-10 glasses per day.   Swab tests will be back in about 2 days. We will follow up with any abnormal results that require further treatment  Follow up with your primary care provider as needed.   Go to the Emergency Department if you experience severe pain, shortness of breath, high fever, or other concerns.

## 2020-12-26 NOTE — ED Triage Notes (Signed)
Increased vaginal discharge with odor  x 1 week and frequent urination.

## 2020-12-26 NOTE — ED Provider Notes (Signed)
[facility]   CC: UTI  SUBJECTIVE:  Gina Roman is a 20 y.o. female who complains of urinary frequency, urgency and dysuria for the past week.  She also reports increased vaginal discharge with an odor for the last week. Patient denies a precipitating event, recent sexual encounter, excessive caffeine intake. Denies pain.  Has not tried OTC medications without relief. Symptoms are made worse with urination. Admits to similar symptoms in the past. Denies fever, chills, nausea, vomiting, abdominal pain, flank pain, abnormal vaginal bleeding, hematuria.    LMP: Patient's last menstrual period was 11/07/2020.  ROS: As in HPI.  All other pertinent ROS negative.     Past Medical History:  Diagnosis Date  . BV (bacterial vaginosis) 10/10/2019  . Constipation   . Kidney infection    Past Surgical History:  Procedure Laterality Date  . NO PAST SURGERIES     No Known Allergies No current facility-administered medications on file prior to encounter.   Current Outpatient Medications on File Prior to Encounter  Medication Sig Dispense Refill  . Norgestimate-Ethinyl Estradiol Triphasic 0.18/0.215/0.25 MG-35 MCG tablet Take 1 tablet by mouth daily. 28 tablet 11  . ondansetron (ZOFRAN ODT) 4 MG disintegrating tablet Take 1 tablet (4 mg total) by mouth every 8 (eight) hours as needed for nausea or vomiting. (Patient not taking: Reported on 10/03/2020) 20 tablet 0   Social History   Socioeconomic History  . Marital status: Single    Spouse name: Not on file  . Number of children: 1  . Years of education: Not on file  . Highest education level: Not on file  Occupational History  . Not on file  Tobacco Use  . Smoking status: Passive Smoke Exposure - Never Smoker  . Smokeless tobacco: Never Used  Vaping Use  . Vaping Use: Never used  Substance and Sexual Activity  . Alcohol use: No  . Drug use: No  . Sexual activity: Not Currently    Birth control/protection: None,  I.U.D.  Other Topics Concern  . Not on file  Social History Narrative  . Not on file   Social Determinants of Health   Financial Resource Strain: Not on file  Food Insecurity: Not on file  Transportation Needs: Not on file  Physical Activity: Not on file  Stress: Not on file  Social Connections: Not on file  Intimate Partner Violence: Not on file   No family history on file.  OBJECTIVE:  Vitals:   12/26/20 1756  BP: 101/65  Pulse: 86  Resp: 19  Temp: 98.6 F (37 C)  TempSrc: Oral  SpO2: 98%   General appearance: AOx3 in no acute distress HEENT: NCAT. Oropharynx clear.  Lungs: clear to auscultation bilaterally without adventitious breath sounds Heart: regular rate and rhythm. Radial pulses 2+ symmetrical bilaterally Abdomen: soft; non-distended; no tenderness; bowel sounds present; no guarding or rebound tenderness Back: no CVA tenderness Extremities: no edema; symmetrical with no gross deformities Skin: warm and dry Neurologic: Ambulates from chair to exam table without difficulty Psychological: alert and cooperative; normal mood and affect  Labs Reviewed  POCT URINALYSIS DIP (MANUAL ENTRY) - Abnormal; Notable for the following components:      Result Value   Blood, UA trace-intact (*)    Leukocytes, UA Small (1+) (*)    All other components within normal limits  URINE CULTURE  CERVICOVAGINAL ANCILLARY ONLY    ASSESSMENT & PLAN:  1. Dysuria   2. Urinary frequency   3. Vaginal discharge  Cytology self swab obtained Will inform of abnormal results and treat accordingly UA positive for blood and white cells Urine culture sent  We will call you with abnormal results that need further treatment Push fluids and get plenty of rest Follow up with PCP if symptoms persists Return here or go to ER if you have any new or worsening symptoms such as fever, worsening abdominal pain, nausea/vomiting, flank pain  Outlined signs and symptoms indicating need for  more acute intervention Patient verbalized understanding After Visit Summary given     Moshe Cipro, NP 12/26/20 1827

## 2020-12-27 LAB — CERVICOVAGINAL ANCILLARY ONLY
Bacterial Vaginitis (gardnerella): POSITIVE — AB
Candida Glabrata: NEGATIVE
Candida Vaginitis: NEGATIVE
Chlamydia: NEGATIVE
Comment: NEGATIVE
Comment: NEGATIVE
Comment: NEGATIVE
Comment: NEGATIVE
Comment: NEGATIVE
Comment: NORMAL
Neisseria Gonorrhea: NEGATIVE
Trichomonas: NEGATIVE

## 2020-12-28 ENCOUNTER — Telehealth (HOSPITAL_COMMUNITY): Payer: Self-pay | Admitting: Emergency Medicine

## 2020-12-28 LAB — URINE CULTURE

## 2020-12-28 MED ORDER — METRONIDAZOLE 0.75 % VA GEL: 1.0000 | Freq: Every day | VAGINAL | 0 refills | Status: AC

## 2020-12-31 DIAGNOSIS — O209 Hemorrhage in early pregnancy, unspecified: Secondary | ICD-10-CM | POA: Diagnosis not present

## 2020-12-31 DIAGNOSIS — O3680X0 Pregnancy with inconclusive fetal viability, not applicable or unspecified: Secondary | ICD-10-CM | POA: Diagnosis not present

## 2020-12-31 DIAGNOSIS — R112 Nausea with vomiting, unspecified: Secondary | ICD-10-CM | POA: Diagnosis not present

## 2020-12-31 DIAGNOSIS — O200 Threatened abortion: Secondary | ICD-10-CM | POA: Diagnosis not present

## 2021-01-08 DIAGNOSIS — O200 Threatened abortion: Secondary | ICD-10-CM | POA: Diagnosis not present

## 2021-01-08 DIAGNOSIS — O26891 Other specified pregnancy related conditions, first trimester: Secondary | ICD-10-CM | POA: Diagnosis not present

## 2021-01-08 DIAGNOSIS — Z8742 Personal history of other diseases of the female genital tract: Secondary | ICD-10-CM | POA: Diagnosis not present

## 2021-01-08 DIAGNOSIS — Z369 Encounter for antenatal screening, unspecified: Secondary | ICD-10-CM | POA: Diagnosis not present

## 2021-01-08 DIAGNOSIS — Z113 Encounter for screening for infections with a predominantly sexual mode of transmission: Secondary | ICD-10-CM | POA: Diagnosis not present

## 2021-01-08 DIAGNOSIS — Z8619 Personal history of other infectious and parasitic diseases: Secondary | ICD-10-CM | POA: Diagnosis not present

## 2021-01-08 DIAGNOSIS — R945 Abnormal results of liver function studies: Secondary | ICD-10-CM | POA: Diagnosis not present

## 2021-01-08 DIAGNOSIS — Z3480 Encounter for supervision of other normal pregnancy, unspecified trimester: Secondary | ICD-10-CM | POA: Diagnosis not present

## 2021-01-08 DIAGNOSIS — O3680X0 Pregnancy with inconclusive fetal viability, not applicable or unspecified: Secondary | ICD-10-CM | POA: Diagnosis not present

## 2021-01-08 DIAGNOSIS — Z6791 Unspecified blood type, Rh negative: Secondary | ICD-10-CM | POA: Diagnosis not present

## 2021-01-08 DIAGNOSIS — Z8759 Personal history of other complications of pregnancy, childbirth and the puerperium: Secondary | ICD-10-CM | POA: Diagnosis not present

## 2021-01-08 DIAGNOSIS — Z3481 Encounter for supervision of other normal pregnancy, first trimester: Secondary | ICD-10-CM | POA: Diagnosis not present

## 2021-01-08 DIAGNOSIS — Z3A01 Less than 8 weeks gestation of pregnancy: Secondary | ICD-10-CM | POA: Diagnosis not present

## 2021-02-05 DIAGNOSIS — Z3A12 12 weeks gestation of pregnancy: Secondary | ICD-10-CM | POA: Diagnosis not present

## 2021-02-05 DIAGNOSIS — Z3480 Encounter for supervision of other normal pregnancy, unspecified trimester: Secondary | ICD-10-CM | POA: Diagnosis not present

## 2021-02-05 DIAGNOSIS — O3680X0 Pregnancy with inconclusive fetal viability, not applicable or unspecified: Secondary | ICD-10-CM | POA: Diagnosis not present

## 2021-02-05 DIAGNOSIS — N898 Other specified noninflammatory disorders of vagina: Secondary | ICD-10-CM | POA: Diagnosis not present

## 2021-02-05 DIAGNOSIS — Z1389 Encounter for screening for other disorder: Secondary | ICD-10-CM | POA: Diagnosis not present

## 2021-03-26 DIAGNOSIS — Z3689 Encounter for other specified antenatal screening: Secondary | ICD-10-CM | POA: Diagnosis not present

## 2021-03-26 DIAGNOSIS — Z3A19 19 weeks gestation of pregnancy: Secondary | ICD-10-CM | POA: Diagnosis not present

## 2021-03-28 ENCOUNTER — Other Ambulatory Visit: Payer: Self-pay

## 2021-03-28 ENCOUNTER — Encounter: Payer: Self-pay | Admitting: Emergency Medicine

## 2021-03-28 ENCOUNTER — Ambulatory Visit
Admission: EM | Admit: 2021-03-28 | Discharge: 2021-03-28 | Disposition: A | Discharge status: Home / Self Care | Payer: Medicaid Other | Attending: Family Medicine | Admitting: Family Medicine

## 2021-03-28 DIAGNOSIS — J069 Acute upper respiratory infection, unspecified: Secondary | ICD-10-CM

## 2021-03-28 DIAGNOSIS — Z3A19 19 weeks gestation of pregnancy: Secondary | ICD-10-CM

## 2021-03-28 MED ORDER — DIPHENHYDRAMINE HCL 25 MG PO TABS: 25.0000 mg | ORAL_TABLET | Freq: Four times a day (QID) | ORAL | 0 refills | Status: DC

## 2021-03-28 MED ORDER — PSEUDOEPHEDRINE HCL 60 MG PO TABS: 60.0000 mg | ORAL_TABLET | ORAL | 0 refills | Status: DC | PRN

## 2021-03-28 MED ORDER — GUAIFENESIN ER 600 MG PO TB12: 1200.0000 mg | ORAL_TABLET | Freq: Two times a day (BID) | ORAL | 0 refills | Status: DC | PRN

## 2021-03-28 NOTE — ED Provider Notes (Signed)
[facility]    CSN: [id number] Arrival date & time: 03/28/21  0957      History   Chief Complaint Chief Complaint  Patient presents with  . Sore Throat  . Cough  . Otalgia    HPI Gina Roman is a 20 y.o. female.   HPI  Patient presents today with sore throat, cough, and ear pain x 3 days. She is approximately [redacted] weeks pregnant. Has not treated symptoms due to pregnancy.  She is afebrile.  She has not taken any medication as she is pregnant and feared taking medication from over-the-counter. Past Medical History:  Diagnosis Date  . BV (bacterial vaginosis) 10/10/2019  . Constipation   . Kidney infection     Patient Active Problem List   Diagnosis Date Noted  . Fatigue 01/04/2020  . BV (bacterial vaginosis) 10/10/2019  . Chlamydia 01/31/2019  . Encounter for insertion of intrauterine contraceptive device (IUD) 05/17/2018    Past Surgical History:  Procedure Laterality Date  . NO PAST SURGERIES      OB History    Gravida  2   Para  1   Term  1   Preterm      AB      Living  1     SAB      IAB      Ectopic      Multiple  0   Live Births  1            Home Medications    Prior to Admission medications   Medication Sig Start Date End Date Taking? Authorizing Provider  Norgestimate-Ethinyl Estradiol Triphasic 0.18/0.215/0.25 MG-35 MCG tablet Take 1 tablet by mouth daily. 10/03/20   Marylene Land, CNM  ondansetron (ZOFRAN ODT) 4 MG disintegrating tablet Take 1 tablet (4 mg total) by mouth every 8 (eight) hours as needed for nausea or vomiting. Patient not taking: Reported on 10/03/2020 09/16/20   Leone Brand    Family History History reviewed. No pertinent family history.  Social History Social History   Tobacco Use  . Smoking status: Passive Smoke Exposure - Never Smoker  . Smokeless tobacco: Never Used  Vaping Use  . Vaping Use: Never used  Substance Use Topics  . Alcohol use: No  . Drug  use: No     Allergies   Patient has no known allergies.   Review of Systems Review of Systems Pertinent negatives listed in HPI   Physical Exam Triage Vital Signs ED Triage Vitals  Enc Vitals Group     BP 03/28/21 1021 107/71     Pulse Rate 03/28/21 1021 94     Resp 03/28/21 1021 16     Temp 03/28/21 1021 99 F (37.2 C)     Temp Source 03/28/21 1021 Oral     SpO2 03/28/21 1021 98 %     Weight --      Height --      Head Circumference --      Peak Flow --      Pain Score 03/28/21 1019 0     Pain Loc --      Pain Edu? --      Excl. in GC? --    No data found.  Updated Vital Signs BP 107/71 (BP Location: Right Arm)   Pulse 94   Temp 99 F (37.2 C) (Oral)   Resp 16   LMP 11/07/2020   SpO2 98%   Visual Acuity Right  Eye Distance:   Left Eye Distance:   Bilateral Distance:    Right Eye Near:   Left Eye Near:    Bilateral Near:     Physical Exam  General Appearance:    Alert, cooperative, no distress  HENT:   Normocephalic, ears normal, nares mucosal edema with congestion, rhinorrhea, oropharynx    Eyes:    PERRL, conjunctiva/corneas clear, EOM's intact       Lungs:     Clear to auscultation bilaterally, respirations unlabored  Heart:    Regular rate and rhythm  Neurologic:   Awake, alert, oriented x 3. No apparent focal neurological           defect.      UC Treatments / Results  Labs (all labs ordered are listed, but only abnormal results are displayed) Labs Reviewed - No data to display  EKG   Radiology No results found.  Procedures Procedures (including critical care time)  Medications Ordered in UC Medications - No data to display  Initial Impression / Assessment and Plan / UC Course  I have reviewed the triage vital signs and the nursing notes.  Pertinent labs & imaging results that were available during my care of the patient were reviewed by me and considered in my medical decision making (see chart for details).     Viral URI  with cough, [redacted] weeks gestation of pregnancy.  Treatment per discharge medications. Symptoms are viral therefore antibiotics are not warranted today. Patient advised to follow-up with primary care doctor if no improvement in symptoms. Work note provided.  Final Clinical Impressions(s) / UC Diagnoses   Final diagnoses:  Viral URI with cough  [redacted] weeks gestation of pregnancy   Discharge Instructions   None    ED Prescriptions    Medication Sig Dispense Auth. Provider   pseudoephedrine (SUDAFED) 60 MG tablet Take 1 tablet (60 mg total) by mouth every 4 (four) hours as needed for congestion. 60 tablet Bing Neighbors, FNP   guaiFENesin (MUCINEX) 600 MG 12 hr tablet Take 2 tablets (1,200 mg total) by mouth 2 (two) times daily as needed. 24 tablet Bing Neighbors, FNP   diphenhydrAMINE (BENADRYL) 25 MG tablet Take 1 tablet (25 mg total) by mouth every 6 (six) hours. 20 tablet Bing Neighbors, FNP     PDMP not reviewed this encounter.   Bing Neighbors, [place] 04/01/21 2055

## 2021-03-28 NOTE — ED Triage Notes (Signed)
Pt presents with sore throat, cough, and ear pain xs 3 days. States has not taken any OTC medications due to pregnancy.

## 2021-05-27 DIAGNOSIS — Z3483 Encounter for supervision of other normal pregnancy, third trimester: Secondary | ICD-10-CM | POA: Diagnosis not present

## 2021-05-27 DIAGNOSIS — Z3A28 28 weeks gestation of pregnancy: Secondary | ICD-10-CM | POA: Diagnosis not present

## 2021-05-27 DIAGNOSIS — Z23 Encounter for immunization: Secondary | ICD-10-CM | POA: Diagnosis not present

## 2021-05-27 DIAGNOSIS — Z3480 Encounter for supervision of other normal pregnancy, unspecified trimester: Secondary | ICD-10-CM | POA: Diagnosis not present

## 2021-06-21 DIAGNOSIS — Z3480 Encounter for supervision of other normal pregnancy, unspecified trimester: Secondary | ICD-10-CM | POA: Diagnosis not present

## 2021-06-27 DIAGNOSIS — Z3480 Encounter for supervision of other normal pregnancy, unspecified trimester: Secondary | ICD-10-CM | POA: Diagnosis not present

## 2021-07-25 DIAGNOSIS — Z3480 Encounter for supervision of other normal pregnancy, unspecified trimester: Secondary | ICD-10-CM | POA: Diagnosis not present

## 2021-08-08 DIAGNOSIS — O99113 Other diseases of the blood and blood-forming organs and certain disorders involving the immune mechanism complicating pregnancy, third trimester: Secondary | ICD-10-CM | POA: Diagnosis not present

## 2021-08-08 DIAGNOSIS — D696 Thrombocytopenia, unspecified: Secondary | ICD-10-CM | POA: Diagnosis not present

## 2021-08-28 DIAGNOSIS — Z3A41 41 weeks gestation of pregnancy: Secondary | ICD-10-CM | POA: Diagnosis not present

## 2021-08-28 DIAGNOSIS — O26893 Other specified pregnancy related conditions, third trimester: Secondary | ICD-10-CM | POA: Diagnosis not present

## 2021-08-28 DIAGNOSIS — O99113 Other diseases of the blood and blood-forming organs and certain disorders involving the immune mechanism complicating pregnancy, third trimester: Secondary | ICD-10-CM | POA: Diagnosis not present

## 2021-08-28 DIAGNOSIS — D696 Thrombocytopenia, unspecified: Secondary | ICD-10-CM | POA: Diagnosis not present

## 2021-08-28 DIAGNOSIS — Z28310 Unvaccinated for covid-19: Secondary | ICD-10-CM | POA: Diagnosis not present

## 2021-08-28 DIAGNOSIS — Z6721 Type B blood, Rh negative: Secondary | ICD-10-CM | POA: Diagnosis not present

## 2021-08-28 DIAGNOSIS — Z20822 Contact with and (suspected) exposure to covid-19: Secondary | ICD-10-CM | POA: Diagnosis not present

## 2021-08-28 DIAGNOSIS — Z2821 Immunization not carried out because of patient refusal: Secondary | ICD-10-CM | POA: Diagnosis not present

## 2021-08-28 DIAGNOSIS — O480 Post-term pregnancy: Secondary | ICD-10-CM | POA: Diagnosis not present

## 2021-08-29 DIAGNOSIS — O9912 Other diseases of the blood and blood-forming organs and certain disorders involving the immune mechanism complicating childbirth: Secondary | ICD-10-CM | POA: Diagnosis not present

## 2021-08-29 DIAGNOSIS — Z3A41 41 weeks gestation of pregnancy: Secondary | ICD-10-CM | POA: Diagnosis not present

## 2021-08-29 DIAGNOSIS — D696 Thrombocytopenia, unspecified: Secondary | ICD-10-CM | POA: Diagnosis not present

## 2021-08-29 DIAGNOSIS — O480 Post-term pregnancy: Secondary | ICD-10-CM | POA: Diagnosis not present

## 2021-08-29 DIAGNOSIS — O360130 Maternal care for anti-D [Rh] antibodies, third trimester, not applicable or unspecified: Secondary | ICD-10-CM | POA: Diagnosis not present

## 2021-09-02 ENCOUNTER — Telehealth: Payer: Self-pay

## 2021-09-02 NOTE — Telephone Encounter (Signed)
Transition Care Management Unsuccessful Follow-up Telephone Call  Date of discharge and from where:  08/30/2021-[facility]   Attempts:  1st Attempt  Reason for unsuccessful TCM follow-up call:  Voice mail full

## 2021-09-03 NOTE — Telephone Encounter (Signed)
Transition Care Management Unsuccessful Follow-up Telephone Call  Date of discharge and from where:  08/30/2021 from [facility]  Attempts:  2nd Attempt  Reason for unsuccessful TCM follow-up call:  Voice mail full

## 2021-09-04 NOTE — Telephone Encounter (Signed)
Transition Care Management Follow-up Telephone Call Date of discharge and from where: 08/30/2021 from [facility] How have you been since you were released from the hospital? Pt stated that she is feeling well and did not have any questions or concerns.  Any questions or concerns? No  Items Reviewed: Did the pt receive and understand the discharge instructions provided? Yes  Medications obtained and verified? Yes  Other? No  Any new allergies since your discharge? No  Dietary orders reviewed? No Do you have support at home? Yes    Follow up appointments reviewed:  PCP Hospital f/u appt confirmed? No   Specialist Hospital f/u appt confirmed? Yes  Scheduled to see OBGYN on 10/03/21 @ 2:40pm. Are transportation arrangements needed? No  If their condition worsens, is the pt aware to call PCP or go to the Emergency Dept.? Yes Was the patient provided with contact information for the PCP's office or ED? Yes Was to pt encouraged to call back with questions or concerns? Yes

## 2021-10-29 ENCOUNTER — Ambulatory Visit (INDEPENDENT_AMBULATORY_CARE_PROVIDER_SITE_OTHER): Payer: Medicaid Other | Admitting: Adult Health

## 2021-10-29 ENCOUNTER — Encounter: Payer: Self-pay | Admitting: Adult Health

## 2021-10-29 ENCOUNTER — Other Ambulatory Visit (HOSPITAL_COMMUNITY)
Admission: RE | Admit: 2021-10-29 | Discharge: 2021-10-29 | Disposition: A | Discharge status: Home / Self Care | Payer: Medicaid Other | Source: Ambulatory Visit | Attending: Adult Health | Admitting: Adult Health

## 2021-10-29 ENCOUNTER — Other Ambulatory Visit: Payer: Self-pay

## 2021-10-29 VITALS — BP 93/64 | HR 85 | Ht 64.0 in | Wt 162.0 lb

## 2021-10-29 DIAGNOSIS — Z3009 Encounter for other general counseling and advice on contraception: Secondary | ICD-10-CM | POA: Diagnosis not present

## 2021-10-29 DIAGNOSIS — Z124 Encounter for screening for malignant neoplasm of cervix: Secondary | ICD-10-CM | POA: Insufficient documentation

## 2021-10-29 LAB — POCT URINE PREGNANCY: Preg Test, Ur: NEGATIVE

## 2021-10-29 NOTE — Patient Instructions (Signed)
No sex til IUD placed

## 2021-10-29 NOTE — Progress Notes (Signed)
  Subjective:     Patient ID: Gina Roman, female   DOB: 05/20/2001, 20 y.o.   MRN: [id number]  HPI Gina Roman is a 20 year old white female,single, G2P2 in wanting to discuss birth control, she had vaginal delivery 08/29/21, and is pumping and had sex last night, with condom. She needs pap. She is thinking IUD, has had one before, but will discuss all options.  PCP is N. Alexander NP  Review of Systems Spotting some since delivery Reviewed past medical,surgical, social and family history. Reviewed medications and allergies.     Objective:   Physical Exam BP 93/64 (BP Location: Left Arm, Patient Position: Sitting, Cuff Size: Normal)   Pulse 85   Ht 5\' 4"  (1.626 m)   Wt 162 lb (73.5 kg)   Breastfeeding Yes   BMI 27.81 kg/m     UPT is negative. Skin warm and dry.Pelvic: external genitalia is normal in appearance no lesions, vagina: pink discharge without odor,urethra has no lesions or masses noted, cervix:smooth and bulbous,Pap performed with GC/CHL, uterus: normal size, shape and contour, non tender, no masses felt, adnexa: no masses or tenderness noted. Bladder is non tender and no masses felt.   Upstream - 10/29/21 1144       Pregnancy Intention Screening   Does the patient want to become pregnant in the next year? No    Does the patient's partner want to become pregnant in the next year? No    Would the patient like to discuss contraceptive options today? Yes      Contraception Wrap Up   Current Method Female Condom    End Method Female Condom;IUD or IUS    Contraception Counseling Provided Yes            Examination chaperoned by 10/31/21 NP student  Assessment:     1. Routine cervical smear Pap sent   2. General counseling and advice for contraceptive management No sex Return in 2 weeks for IUD insertion    Plan:     No sex, return in 2 weeks for IUD placement

## 2021-11-04 ENCOUNTER — Encounter: Payer: Self-pay | Admitting: Adult Health

## 2021-11-04 DIAGNOSIS — R87610 Atypical squamous cells of undetermined significance on cytologic smear of cervix (ASC-US): Secondary | ICD-10-CM

## 2021-11-04 HISTORY — DX: Atypical squamous cells of undetermined significance on cytologic smear of cervix (ASC-US): R87.610

## 2021-11-04 LAB — CYTOLOGY - PAP
Chlamydia: NEGATIVE
Comment: NEGATIVE
Comment: NEGATIVE
Comment: NORMAL
Diagnosis: UNDETERMINED — AB
High risk HPV: NEGATIVE
Neisseria Gonorrhea: NEGATIVE

## 2021-11-13 ENCOUNTER — Encounter: Payer: Self-pay | Admitting: Adult Health

## 2021-11-13 ENCOUNTER — Ambulatory Visit (INDEPENDENT_AMBULATORY_CARE_PROVIDER_SITE_OTHER): Payer: Medicaid Other | Admitting: Adult Health

## 2021-11-13 ENCOUNTER — Other Ambulatory Visit: Payer: Self-pay

## 2021-11-13 VITALS — BP 103/65 | HR 84 | Ht 64.0 in | Wt 162.0 lb

## 2021-11-13 DIAGNOSIS — Z3202 Encounter for pregnancy test, result negative: Secondary | ICD-10-CM | POA: Diagnosis not present

## 2021-11-13 DIAGNOSIS — Z30430 Encounter for insertion of intrauterine contraceptive device: Secondary | ICD-10-CM

## 2021-11-13 LAB — POCT URINE PREGNANCY: Preg Test, Ur: NEGATIVE

## 2021-11-13 MED ORDER — LEVONORGESTREL 20 MCG/DAY IU IUD
1.0000 | INTRAUTERINE_SYSTEM | Freq: Once | INTRAUTERINE | Status: AC
  Administered 2021-11-13: 16:00:00 1 via INTRAUTERINE

## 2021-11-13 NOTE — Progress Notes (Addendum)
Patient ID: Gina Roman, female   DOB: [birth year], 20 y.o.   MRN: [id number] [name] is a 20 y.o. year old  female,single, G2P2, who presents for placement of a mirena IUD. Her LMP was UNK, still spotting from delivery and her pregnancy test today is negative.  Last sex 10/28/21.   BP 103/65 (BP Location: Left Arm, Patient Position: Sitting, Cuff Size: Normal)    Pulse 84    Ht 5\' 4"  (1.626 m)    Wt 162 lb (73.5 kg)    LMP  (LMP Unknown)    Breastfeeding Yes    BMI 27.81 kg/m  Consent signed. Mirena Lot [id number] Exp 2025/JAN   The risks and benefits of the method and placement have been thouroughly reviewed with the patient and all questions were answered.  Specifically the patient is aware of failure rate of 12/998, expulsion of the IUD and of possible perforation.  The patient is aware of irregular bleeding due to the method and understands the incidence of irregular bleeding diminishes with time. She is aware it is good for 8 years and card given. Time out was performed.  A Graves speculum was placed.  The cervix was prepped using Betadine. The uterus was found to be neutral and it sounded to 7 cm.  The cervix was grasped with a tenaculum and the IUD was inserted to 7 cm.  It was pulled back 1 cm and the IUD was disengaged.  The strings were trimmed to 3 cm.  Sonogram was performed and the proper placement of the IUD was verified.  The patient was instructed on signs and symptoms of infection and to check for the strings after each menses or each month.  The patient is to refrain from intercourse for 3 days.  The patient is scheduled for a return appointment after her first menses or 4 weeks.  [name],NP 11/13/2021 3:27 PM

## 2021-11-22 DIAGNOSIS — N760 Acute vaginitis: Secondary | ICD-10-CM | POA: Diagnosis not present

## 2021-11-22 DIAGNOSIS — B9689 Other specified bacterial agents as the cause of diseases classified elsewhere: Secondary | ICD-10-CM | POA: Diagnosis not present

## 2021-12-11 ENCOUNTER — Ambulatory Visit: Payer: Medicaid Other | Admitting: Adult Health

## 2021-12-18 ENCOUNTER — Ambulatory Visit: Payer: Medicaid Other | Admitting: Adult Health
